# Patient Record
Sex: Male | Born: 1970 | Race: White | Hispanic: No | Marital: Married | Smoking: Former smoker
Health system: Southern US, Community
[De-identification: ages and names within clinical notes are randomized; demographics above are authoritative.]

## PROBLEM LIST (undated history)

## (undated) DIAGNOSIS — K219 Gastro-esophageal reflux disease without esophagitis: Secondary | ICD-10-CM

## (undated) DIAGNOSIS — E7800 Pure hypercholesterolemia, unspecified: Secondary | ICD-10-CM

## (undated) HISTORY — PX: APPENDECTOMY: SHX54

## (undated) HISTORY — PX: OTHER SURGICAL HISTORY: SHX169

---

## 2000-11-21 ENCOUNTER — Encounter: Payer: Self-pay | Admitting: Family Medicine

## 2000-11-21 ENCOUNTER — Ambulatory Visit (HOSPITAL_COMMUNITY): Admission: RE | Admit: 2000-11-21 | Discharge: 2000-11-21 | Payer: Self-pay | Admitting: Family Medicine

## 2002-04-12 ENCOUNTER — Ambulatory Visit (HOSPITAL_COMMUNITY): Admission: RE | Admit: 2002-04-12 | Discharge: 2002-04-12 | Payer: Self-pay | Admitting: Family Medicine

## 2002-04-12 ENCOUNTER — Encounter: Payer: Self-pay | Admitting: Family Medicine

## 2002-07-17 ENCOUNTER — Ambulatory Visit (HOSPITAL_COMMUNITY): Admission: RE | Admit: 2002-07-17 | Discharge: 2002-07-17 | Payer: Self-pay | Admitting: General Surgery

## 2003-01-16 ENCOUNTER — Encounter: Payer: Self-pay | Admitting: Emergency Medicine

## 2003-01-16 ENCOUNTER — Observation Stay (HOSPITAL_COMMUNITY): Admission: EM | Admit: 2003-01-16 | Discharge: 2003-01-17 | Payer: Self-pay | Admitting: Emergency Medicine

## 2004-11-04 ENCOUNTER — Ambulatory Visit (HOSPITAL_COMMUNITY): Admission: RE | Admit: 2004-11-04 | Discharge: 2004-11-04 | Payer: Self-pay | Admitting: Family Medicine

## 2006-02-21 ENCOUNTER — Encounter (INDEPENDENT_AMBULATORY_CARE_PROVIDER_SITE_OTHER): Payer: Self-pay | Admitting: *Deleted

## 2006-02-21 ENCOUNTER — Ambulatory Visit (HOSPITAL_COMMUNITY): Admission: RE | Admit: 2006-02-21 | Discharge: 2006-02-21 | Payer: Self-pay | Admitting: General Surgery

## 2007-10-17 ENCOUNTER — Encounter: Admission: RE | Admit: 2007-10-17 | Discharge: 2007-10-17 | Payer: Self-pay | Admitting: Family Medicine

## 2008-04-16 ENCOUNTER — Encounter: Admission: RE | Admit: 2008-04-16 | Discharge: 2008-04-16 | Payer: Self-pay | Admitting: Gastroenterology

## 2008-04-29 ENCOUNTER — Ambulatory Visit (HOSPITAL_COMMUNITY): Admission: RE | Admit: 2008-04-29 | Discharge: 2008-04-29 | Payer: Self-pay | Admitting: Gastroenterology

## 2008-07-30 ENCOUNTER — Encounter: Admission: RE | Admit: 2008-07-30 | Discharge: 2008-07-30 | Payer: Self-pay | Admitting: Gastroenterology

## 2009-09-14 IMAGING — NM NM HEPATO W/GB/PHARM/[PERSON_NAME]
2 series · 12 of 12 positions shown · non-contrast
Comparison: Ultrasound 04/29/2008

CLINICAL DATA: Abdominal pain, nausea.

NUCLEAR MEDICINE HEPATOBILIARY IMAGING WITH GALLBLADDER EF
TECHNIQUE: Sequential images of the abdomen were obtained [DATE]
minutes following intravenous administration of
radiopharmaceutical.  After slow intravenous infusion of 4.5ucg
Cholecystokinin, gallbladder ejection fraction was determined.
Radiopharmaceutical:  2.QmCi Pc-77m Choletec

[Series 1: he hepato · 4.75mm/px · 6 of 60 frames shown (1 of 2)]
[frame 6/60]
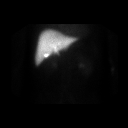
[frame 16/60]
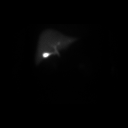
[frame 26/60]
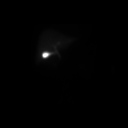
[frame 36/60]
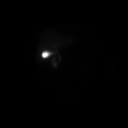
[frame 46/60]
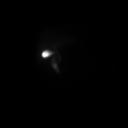
[frame 56/60]
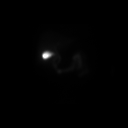

[Series 1: he hepato · 4.75mm/px · 6 of 32 frames shown (2 of 2)]
[frame 3/32]
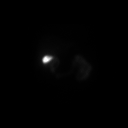
[frame 8/32]
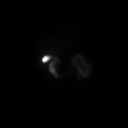
[frame 14/32]
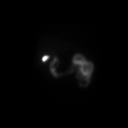
[frame 19/32]
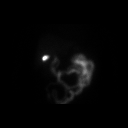
[frame 24/32]
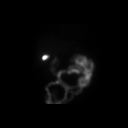
[frame 30/32]
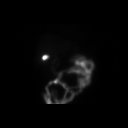

[12 of 12 positions shown; findings below may reference images not displayed]

FINDINGS: There is prompt uptake and excretion of radiotracer by
the liver.  No evidence of cystic duct or common duct obstruction.
Gallbladder ejection fraction is 80%.  Normal is greater than 35%.

The patient did not experience symptoms during CCK infusion.
IMPRESSION: No evidence of obstruction.  Normal gallbladder ejection fraction.

## 2010-10-30 NOTE — H&P (Signed)
NAMEBRYAN, Alexander Garza                ACCOUNT NO.:  [id number]   MEDICAL RECORD NO.:  [id number]          PATIENT TYPE:  AMB   LOCATION:  DAY                           FACILITY:  [facility]   PHYSICIAN:  Dalia Heading, M.D.  DATE OF BIRTH:  12/23/1970   DATE OF ADMISSION:  02/21/2006  DATE OF DISCHARGE:  LH                                HISTORY & PHYSICAL   CHIEF COMPLAINT:  Multiple subcutaneous neoplasms.   HISTORY OF PRESENT ILLNESS:  The patient is a 40 year old white male who  presents with enlargement of multiple masses in the subcutaneous region.  One is inferior to the left breast and another one is along the left hip.  He has multiple other neoplasms which he would like removed.   PAST MEDICAL HISTORY:  History of sinus infections, GERD.   PAST SURGICAL HISTORY:  Laparoscopic appendectomy.   CURRENT MEDICATIONS:  Prevacid.   ALLERGIES:  No known drug allergies.   REVIEW OF SYSTEMS:  The patient smokes a pack of cigarettes a day.  He  denies any significant alcohol use.  He denies any other cardiopulmonary  difficulties or bleeding disorders.   PHYSICAL EXAMINATION:  GENERAL:  The patient is a well-developed, well-  nourished white male in no acute distress.  LUNGS:  Clear to auscultation with equal breath sounds bilaterally.  HEART:  Examination reveals a regular rate and rhythm without S3,  S4, or  murmurs.  SKIN:  Examination reveals a various size subcutaneous masses which are oval  and rubbery in nature.  They are in the left anterior chest wall, left  forearm, sternum, left hip, and right thigh.   IMPRESSION:  Multiple subcutaneous neoplasms.   PLAN:  The patient is scheduled for excision of the multiple neoplasms of  the skin on February 21, 2006.  The risks and benefits of the procedures  including bleeding, infection, and recurrence of the neoplasms were fully  explained to the patient, who gave informed consent.      Dalia Heading, M.D.  Electronically  Signed     MAJ/MEDQ  D:  01/25/2006  T:  01/25/2006  Job:  [id number]   cc:   Patrica Duel, M.D.  Fax: [phone]

## 2010-10-30 NOTE — Op Note (Signed)
NAMETEION, BALLIN                ACCOUNT NO.:  [id number]   MEDICAL RECORD NO.:  [id number]          PATIENT TYPE:  AMB   LOCATION:  DAY                           FACILITY:  [facility]   PHYSICIAN:  Dalia Heading, M.D.  DATE OF BIRTH:  [birth year]   DATE OF PROCEDURE:  02/21/2006  DATE OF DISCHARGE:                                 OPERATIVE REPORT   AGE:  40 years old.   PREOPERATIVE DIAGNOSIS:  Multiple subcutaneous neoplasms, chest wall, left  arm, legs.   POSTOPERATIVE DIAGNOSIS:  Multiple subcutaneous neoplasms, chest wall, left  arm, legs.   PROCEDURE:  Excision of multiple subcutaneous neoplasms, chest wall, left  forearm, bilateral thighs.   SURGEON:  Dr. Franky Macho.   ANESTHESIA:  General endotracheal.   INDICATIONS:  The patient is a 40 year old white male who presents with  multiple enlarging subcutaneous masses on his body.  He has two on the chest  wall, one on the left forearm, one on the left thigh, and one on the right  thigh.  All are various sizes.  The smallest is the mid chest wall which is  1 cm in size and the largest one was on the left thigh and that was 5 cm in  its greatest diameter.  The patient now comes to the operating room for  excision of the neoplasms.  The risks and benefits of the procedure  including recurrence of the masses were fully explained to the patient, who  gave informed consent.   DESCRIPTION OF PROCEDURE:  The patient was placed in supine position.  After  induction of general endotracheal anesthesia, the right thigh, left thigh,  anterior chest wall, and left forearm were all prepped and draped using the  usual sterile technique with Betadine.  Surgical site confirmation was  performed.   The right thigh subcutaneous mass which was approximately 3 cm in its  greatest diameter along the lateral aspect of the thigh was excised without  difficulty.  Any bleeding was controlled using Bovie electrocautery.  0.5 cm  Sensorcaine was  instilled in the surrounding wound.  The skin was closed  using 4-0 Vicryl subcuticular suture.  Dermabond was then applied.   The left lateral thigh mass which was 5 cm in its greatest diameter was  likewise excised.  Any bleeding was controlled using Bovie electrocautery.  The subcutaneous layer was reapproximated using a 4-0 Vicryl interrupted  suture.  The skin was closed using a 4-0 Prolene vertical mattress suture.  0.5 cm Sensorcaine was instilled in the surrounding wound.  Betadine  ointment was then applied.   The left forearm anteriorly had a 3 cm mass present.  This was excised  without difficulty.  The skin was closed using 4-0 Prolene vertical mattress  suture.  0.5 cm Sensorcaine was instilled in the surrounding wound.  Betadine ointment was applied.   Along the left anterior chest wall, inferior to the nipple, a 5 cm lipoma  was found.  This was excised without difficulty.  The subcutaneous layer was  reapproximated using a 3-0 Vicryl interrupted suture.  The  skin was closed  using a 4-0 Vicryl subcuticular suture.  0.5 cm Sensorcaine was instilled in  the surrounding wound.  Dermabond was then applied.   A 1 cm lipoma was found just below the sternal notch anteriorly along the  midline.  This was excised without difficulty.  The skin was reapproximated  using a 4-0 Vicryl interrupted suture.  Dermabond was then applied.  0.5 cm  Sensorcaine was instilled into the surrounding wound.   All of the masses appeared to be lipomas.  All were sent separately to  pathology for further examination and treatment.   The patient tolerated the procedure well.  He was extubated in the operating  room and went back to the recovery room awake in stable condition.  Complications none.   SPECIMEN:  Multiple benign-appearing neoplasms, right thigh, left thigh,  left forearm, 2 on the anterior chest wall.  Blood loss minimal.      Dalia Heading, M.D.  Electronically  Signed     MAJ/MEDQ  D:  02/21/2006  T:  02/21/2006  Job:  [id number]   cc:   Patrica Duel, M.D.  Fax: [phone]

## 2010-10-30 NOTE — H&P (Signed)
   NAMEDAMETRIUS, SANJUAN NO.:  [id number]   MEDICAL RECORD NO.:  [id number]                  PATIENT TYPE:   LOCATION:                                       FACILITY:  [facility]   PHYSICIAN:  Dalia Heading, M.D.               DATE OF BIRTH:  [birth year]   DATE OF ADMISSION:  DATE OF DISCHARGE:                                HISTORY & PHYSICAL   CHIEF COMPLAINT:  Dysphagia, GERD.   HISTORY OF PRESENT ILLNESS:  The patient is a 40 year old white male who is  referred for an EGD.  He has been having epigastric pain, GERD, and sore  throat intermittently for the past three weeks.  Aciphex has been only  mildly helpful.  No nausea or vomiting have been noted.  He also complains  of some dysphagia of solid foods.  He denies any lightheadedness, weight  loss, fever, constipation, diarrhea, hematochezia, or melena.  There is no  history of peptic ulcer disease.   PAST MEDICAL HISTORY:  Sinus infection.   PAST SURGICAL HISTORY:  Unremarkable.   CURRENT MEDICATIONS:  1. Aciphex.  2. Ambien.   ALLERGIES:  No known drug allergies.   REVIEW OF SYSTEMS:  The patient does smoke a pack of cigarettes a day.  Denies any significant alcohol use.  He denies any other cardiopulmonary  difficulties or bleeding disorders.   PHYSICAL EXAMINATION:  GENERAL:  The patient is a well-developed, well-  nourished white male in no acute distress.  VITAL SIGNS:  He is afebrile and vital signs are stable.  NECK:  Supple without lymphadenopathy.  LUNGS:  Clear to auscultation with equal breath sounds bilaterally.  HEART:  Regular rate and rhythm without S3, S4, or murmurs.  ABDOMEN:  Soft, nontender, nondistended.  No hepatosplenomegaly or masses  are noted.   IMPRESSION:  Dysphagia, gastroesophageal reflux disease.   PLAN:  The patient is scheduled for an EGD on July 17, 2002.  The risks  and benefits of the procedure including bleeding and perforation were fully  explained to the patient, gave informed consent.                                              Dalia Heading, M.D.   MAJ/MEDQ  D:  07/12/2002  T:  07/12/2002  Job:  [id number]

## 2010-10-30 NOTE — Op Note (Signed)
NAME:  Alexander Garza, Alexander Garza                          ACCOUNT NO.:  [id number]   MEDICAL RECORD NO.:  [id number]                   PATIENT TYPE:  EMS   LOCATION:  ED                                   FACILITY:  [facility]   PHYSICIAN:  Dalia Heading, M.D.               DATE OF BIRTH:  May 26, 1971   DATE OF PROCEDURE:  01/16/2003  DATE OF DISCHARGE:                                 OPERATIVE REPORT   PREOPERATIVE DIAGNOSIS:  Acute appendicitis.   POSTOPERATIVE DIAGNOSIS:  Acute appendicitis.   PROCEDURE:  Laparoscopic appendectomy.   SURGEON:  Dalia Heading, M.D.   ANESTHESIA:  General endotracheal   INDICATIONS:  The patient is a 40 year old white male who presents with a  mild leukocytosis and right lower quadrant abdominal pain.  CT scan of the  abdomen and pelvis reveals acute appendicitis.  The patient now comes to the  operating room for laparoscopic appendectomy.  The risks and benefits of the  procedure including bleeding, infection, and the possibility of an open  procedure were fully explained to the patient, who gave informed consent.   DESCRIPTION OF PROCEDURE:  The patient was placed in the supine position.  After induction of general endotracheal anesthesia, the abdomen was prepped  and draped using the usual sterile technique with Betadine.  Surgical site  confirmation was performed.   A supraumbilical incision was made down to the fascia.  A Veress needle was  introduced into the abdominal cavity and confirmation of placement was done  using the saline drop test.  The abdomen was then insufflated to 16 mmHg  pressure.  An 11-mm trocar was introduced into the abdominal cavity under  direct visualization without difficulty.  An additional 12-mm trocar was  placed in the suprapubic region and a 5-mm trocar was placed in the left  lower quadrant region.   The appendix was visualized and noted to be inflamed.  The mesoappendix was  divided using the harmonic scalpel.  An  Endo-GIA was placed across the base  of the appendix and fired.  The appendix was removed using an EndoCatch bag.  The staple line was inspected and noted to be within normal limits.  All  fluid and air were then evacuated from the abdominal cavity prior to removal  of the trocars.   All wounds were irrigated with normal saline.  All wounds were injected with  0.5% Sensorcaine.  The supraumbilical fascia as well as suprapubic fascia  were reapproximated using an #0 Vicryl interrupted suture. All skin  incisions were closed using staples.  Betadine ointment and dry sterile  dressings were applied.   All tape and needle counts were correct at the end of the procedure.  The  patient was extubated in the operating room and went back to recovery room  awake and stable condition.   COMPLICATIONS:  None.   SPECIMENS:  Appendix.   BLOOD  LOSS:  Minimal.                                               Dalia Heading, M.D.    MAJ/MEDQ  D:  01/16/2003  T:  01/16/2003  Job:  [id number]   cc:   Patrica Duel, M.D.  [address]  [place]  [place] [address]  Fax: [phone]

## 2013-02-20 ENCOUNTER — Ambulatory Visit (INDEPENDENT_AMBULATORY_CARE_PROVIDER_SITE_OTHER): Payer: BC Managed Care – PPO | Admitting: Urology

## 2013-02-20 DIAGNOSIS — Z3009 Encounter for other general counseling and advice on contraception: Secondary | ICD-10-CM

## 2013-03-06 ENCOUNTER — Encounter (INDEPENDENT_AMBULATORY_CARE_PROVIDER_SITE_OTHER): Payer: BC Managed Care – PPO | Admitting: Urology

## 2013-03-06 DIAGNOSIS — Z302 Encounter for sterilization: Secondary | ICD-10-CM

## 2013-03-13 ENCOUNTER — Ambulatory Visit (INDEPENDENT_AMBULATORY_CARE_PROVIDER_SITE_OTHER): Payer: Self-pay | Admitting: Urology

## 2013-03-13 DIAGNOSIS — Z09 Encounter for follow-up examination after completed treatment for conditions other than malignant neoplasm: Secondary | ICD-10-CM

## 2014-12-23 ENCOUNTER — Encounter (HOSPITAL_COMMUNITY): Payer: Self-pay | Admitting: *Deleted

## 2014-12-23 ENCOUNTER — Emergency Department (HOSPITAL_COMMUNITY)
Admission: EM | Admit: 2014-12-23 | Discharge: 2014-12-24 | Disposition: A | Discharge status: Home / Self Care | Payer: BLUE CROSS/BLUE SHIELD | Attending: Emergency Medicine | Admitting: Emergency Medicine

## 2014-12-23 DIAGNOSIS — R040 Epistaxis: Secondary | ICD-10-CM | POA: Insufficient documentation

## 2014-12-23 DIAGNOSIS — Z8639 Personal history of other endocrine, nutritional and metabolic disease: Secondary | ICD-10-CM | POA: Diagnosis not present

## 2014-12-23 DIAGNOSIS — Z720 Tobacco use: Secondary | ICD-10-CM | POA: Insufficient documentation

## 2014-12-23 DIAGNOSIS — Z8719 Personal history of other diseases of the digestive system: Secondary | ICD-10-CM | POA: Insufficient documentation

## 2014-12-23 HISTORY — DX: Pure hypercholesterolemia, unspecified: E78.00

## 2014-12-23 HISTORY — DX: Gastro-esophageal reflux disease without esophagitis: K21.9

## 2014-12-23 LAB — CBC
HCT: 42.5 % (ref 39.0–52.0)
Hemoglobin: 14.7 g/dL (ref 13.0–17.0)
MCH: 31.9 pg (ref 26.0–34.0)
MCHC: 34.6 g/dL (ref 30.0–36.0)
MCV: 92.2 fL (ref 78.0–100.0)
Platelets: 229 10*3/uL (ref 150–400)
RBC: 4.61 MIL/uL (ref 4.22–5.81)
RDW: 13.5 % (ref 11.5–15.5)
WBC: 8.5 10*3/uL (ref 4.0–10.5)

## 2014-12-23 LAB — BASIC METABOLIC PANEL
Anion gap: 9 (ref 5–15)
BUN: 21 mg/dL — ABNORMAL HIGH (ref 6–20)
CO2: 25 mmol/L (ref 22–32)
Calcium: 8.7 mg/dL — ABNORMAL LOW (ref 8.9–10.3)
Chloride: 107 mmol/L (ref 101–111)
Creatinine, Ser: 1.05 mg/dL (ref 0.61–1.24)
GFR calc Af Amer: >60 mL/min (ref >60)
GFR calc non Af Amer: >60 mL/min (ref >60)
Glucose, Bld: 95 mg/dL (ref 65–99)
Potassium: 3.8 mmol/L (ref 3.5–5.1)
Sodium: 141 mmol/L (ref 135–145)

## 2014-12-23 LAB — PROTIME-INR
INR: 1.02 (ref 0.00–1.49)
Prothrombin Time: 13.6 seconds (ref 11.6–15.2)

## 2014-12-23 MED ORDER — SILVER NITRATE-POT NITRATE 75-25 % EX MISC
1.0000 "application " | Freq: Once | CUTANEOUS | Status: AC
  Administered 2014-12-23: 1 via TOPICAL
  Filled 2014-12-23: qty 1

## 2014-12-23 MED ORDER — OXYMETAZOLINE HCL 0.05 % NA SOLN
2.0000 | Freq: Once | NASAL | Status: AC
  Administered 2014-12-23: 2 via NASAL
  Filled 2014-12-23: qty 15

## 2014-12-23 NOTE — ED Notes (Signed)
Pt c/o nosebleed x 2 days but tonight it has gotten progressively worse

## 2014-12-23 NOTE — ED Notes (Signed)
   12/23/14 2308  HEENT  HEENT (WDL) X  Nose Other (Comment)  Nasal Drainage Color Bloody  pt says nose bleeding on & off for the past 2 days. Says nose starts bleeding when he bends over. Was plugging a tire & felt like pressure in head before bleeding started.

## 2014-12-23 NOTE — ED Provider Notes (Signed)
CSN: [id number]   Arrival date & time 12/23/14 2037  History  This chart was scribed for  No att. providers found by Bethel Born, ED Scribe. This patient was seen in room APA04/APA04 and the patient's care was started at 11:21 PM.  Chief Complaint  Patient presents with  . Epistaxis    HPI HPI Alexander Garza is a 44 y.o. male who presents to the Emergency Department complaining of left-sided epistaxis with sudden onset 2 days ago after bending over. Today he had 3 episodes of bleeding from the same side after bending. The episodes last for 5 minutes but the last episode today lasted 30 minutes. He notes that blood was pouring out of his nose and he was passing clots. 1 day before the onset of symptoms the patient states that he was straining to plug a tire and all the veins in his neck and head were bulging. He cut his right nostril clipping his nasal hairs and was recently treated with clindamycin for MRSA at Urgent care in [facility].  No history of frequent epistaxis. No anticoagulation. Pt denies cold symptoms, facial pain, headache, and bruising easily. [place] 1 [place]2 [place]. Tobacco+. Occasional alcohol use. Remodels homes for work.   PCP Dr Shary Decamp  Past Medical History  Diagnosis Date  . Hypercholesteremia   . Acid reflux disease     Past Surgical History  Procedure Laterality Date  . Appendectomy    . Fatty tumor removal      chest, arm and leg    History reviewed. No pertinent family history.  History  Substance Use Topics  . Smoking status: Current Every Day Smoker -- 0.50 packs/day  . Smokeless tobacco: Not on file  . Alcohol Use: Yes     Comment: occasionally   self-employed  Review of Systems  HENT: Positive for nosebleeds.   Respiratory: Negative for cough and sputum production.   Neurological: Negative for headaches.  Endo/Heme/Allergies: Does not bruise/bleed easily.  All other systems reviewed and  are negative.   Home Medications  none Prior to Admission medications   Not on File    Allergies  Review of patient's allergies indicates no known allergies.  Triage Vitals: BP 106/75 mmHg  Pulse 50  Temp(Src) 98.1 F (36.7 C) (Oral)  Resp 16  Ht  (1.854 m)  Wt 195 lb (88.451 kg)  BMI 25.73 kg/m2  SpO2 98%  Vital signs normal except for bradycardia   Physical Exam  Constitutional: He is oriented to person, place, and time. He appears well-developed and well-nourished.  Non-toxic appearance. He does not appear ill. No distress.  HENT:  Head: Normocephalic and atraumatic.  Right Ear: External ear normal.  Left Ear: External ear normal.  Nose: Nose normal. No mucosal edema or rhinorrhea.  Mouth/Throat: Oropharynx is clear and moist and mucous membranes are normal. No dental abscesses or uvula swelling.  Both nares were inspected. There was 1 area on the left mid septum that appeared to be the source of bleeding. No blood seen in the posterior pharynx. No active bleeding of the nose noted.  Eyes: Conjunctivae and EOM are normal. Pupils are equal, round, and reactive to light.  Neck: Normal range of motion and full passive range of motion without pain. Neck supple.  Pulmonary/Chest: Effort normal. No respiratory distress. He has no rhonchi. He exhibits no crepitus.  Abdominal: Normal appearance.  Musculoskeletal: Normal range of motion.  Moves all  extremities well.   Neurological: He is alert and oriented to person, place, and time. He has normal strength. No cranial nerve deficit.  Skin: Skin is warm, dry and intact. No rash noted. No erythema. No pallor.  Psychiatric: He has a normal mood and affect. His speech is normal and behavior is normal. His mood appears not anxious.  Nursing note and vitals reviewed.   ED Course  Procedures Medications  oxymetazoline (AFRIN) 0.05 % nasal spray 2 spray (2 sprays Left Nare Given 12/23/14 2343)  silver nitrate applicators  applicator 1 application (1 application Topical Given 12/23/14 2343)     DIAGNOSTIC STUDIES: Oxygen Saturation is with 98% on RA, normal by my interpretation.    COORDINATION OF CARE: 11:27 PM Discussed treatment plan which includes lab work and cauterization with pt at bedside and pt agreed to plan.  Afrin was sprayed in the patient's left nostril. I used a silver nitrate stick to cauterize his left nasal septum. Patient had no bleeding while in the ED. His wife states she sees Dr. Suszanne Connerseoh. We discussed what to do this nose rebleeds which includes ice packs to his forehead, sprain his nose with Afrin soaked pledget, and appropriately pinching the nose for 5 minutes.  Labs Review-  Results for orders placed or performed during the hospital encounter of 12/23/14  CBC  Result Value Ref Range   WBC 8.5 4.0 - 10.5 K/uL   RBC 4.61 4.22 - 5.81 MIL/uL   Hemoglobin 14.7 13.0 - 17.0 g/dL   HCT 16.142.5 09.639.0 - 04.552.0 %   MCV 92.2 78.0 - 100.0 fL   MCH 31.9 26.0 - 34.0 pg   MCHC 34.6 30.0 - 36.0 g/dL   RDW 40.913.5 81.111.5 - 91.415.5 %   Platelets 229 150 - 400 K/uL  Protime-INR  Result Value Ref Range   Prothrombin Time 13.6 11.6 - 15.2 seconds   INR 1.02 0.00 - 1.49  Basic metabolic panel  Result Value Ref Range   Sodium 141 135 - 145 mmol/L   Potassium 3.8 3.5 - 5.1 mmol/L   Chloride 107 101 - 111 mmol/L   CO2 25 22 - 32 mmol/L   Glucose, Bld 95 65 - 99 mg/dL   BUN 21 (H) 6 - 20 mg/dL   Creatinine, Ser 7.821.05 0.61 - 1.24 mg/dL   Calcium 8.7 (L) 8.9 - 10.3 mg/dL   GFR calc non Af Amer >60 >60 mL/min   GFR calc Af Amer >60 >60 mL/min   Anion gap 9 5 - 15   Laboratory interpretation all normal     Imaging Review No results found.  EKG Interpretation None      MDM   Final diagnoses:  Left-sided epistaxis   Plan discharge  Devoria AlbeIva Duanna Runk, MD, FACEP   I personally performed the services described in this documentation, which was scribed in my presence. The recorded information has been  reviewed and considered.  Devoria AlbeIva Raeanne Deschler, MD, Concha PyoFACEP     Kateria Cutrona, MD 12/24/14 [phone]

## 2014-12-24 NOTE — ED Notes (Signed)
Pt alert & oriented x4, stable gait. Patient given discharge instructions, paperwork & prescription(s). Patient  instructed to stop at the registration desk to finish any additional paperwork. Patient verbalized understanding. Pt left department w/ no further questions. 

## 2014-12-24 NOTE — Discharge Instructions (Signed)
Use the afrin spray if the bleeding starts again. You can also put an ice pack on your forehead and pinch your nose like an old fashioned clothes pin and hold it for 5 minutes to stop the bleeding. If you continue to have bleeding, call Dr Suszanne Connerseoh, ENT to have him recheck you.   Nosebleed Nosebleeds can be caused by many conditions, including trauma, infections, polyps, foreign bodies, dry mucous membranes or climate, medicines, and air conditioning. Most nosebleeds occur in the front of the nose. Because of this location, most nosebleeds can be controlled by pinching the nostrils gently and continuously for at least 10 to 20 minutes. The long, continuous pressure allows enough time for the blood to clot. If pressure is released during that 10 to 20 minute time period, the process may have to be started again. The nosebleed may stop by itself or quit with pressure, or it may need concentrated heating (cautery) or pressure from packing. HOME CARE INSTRUCTIONS   If your nose was packed, try to maintain the pack inside until your health care provider removes it. If a gauze pack was used and it starts to fall out, gently replace it or cut the end off. Do not cut if a balloon catheter was used to pack the nose. Otherwise, do not remove unless instructed.  Avoid blowing your nose for 12 hours after treatment. This could dislodge the pack or clot and start the bleeding again.  If the bleeding starts again, sit up and bend forward, gently pinching the front half of your nose continuously for 20 minutes.  If bleeding was caused by dry mucous membranes, use over-the-counter saline nasal spray or gel. This will keep the mucous membranes moist and allow them to heal. If you must use a lubricant, choose the water-soluble variety. Use it only sparingly and not within several hours of lying down.  Do not use petroleum jelly or mineral oil, as these may drip into the lungs and cause serious problems.  Maintain  humidity in your home by using less air conditioning or by using a humidifier.  Do not use aspirin or medicines which make bleeding more likely. Your health care provider can give you recommendations on this.  Resume normal activities as you are able, but try to avoid straining, lifting, or bending at the waist for several days.  If the nosebleeds become recurrent and the cause is unknown, your health care provider may suggest laboratory tests. SEEK MEDICAL CARE IF: You have a fever. SEEK IMMEDIATE MEDICAL CARE IF:   Bleeding recurs and cannot be controlled.  There is unusual bleeding from or bruising on other parts of the body.  Nosebleeds continue.  There is any worsening of the condition which originally brought you in.  You become light-headed, feel faint, become sweaty, or vomit blood. MAKE SURE YOU:   Understand these instructions.  Will watch your condition.  Will get help right away if you are not doing well or get worse. Document Released: 03/10/2005 Document Revised: 10/15/2013 Document Reviewed: 05/01/2009 [facility] Patient Information 2015 [place], [place]. This information is not intended to replace advice given to you by your health care provider. Make sure you discuss any questions you have with your health care provider.

## 2016-08-05 ENCOUNTER — Encounter (INDEPENDENT_AMBULATORY_CARE_PROVIDER_SITE_OTHER): Payer: Self-pay | Admitting: Orthopedic Surgery

## 2016-08-05 ENCOUNTER — Ambulatory Visit (INDEPENDENT_AMBULATORY_CARE_PROVIDER_SITE_OTHER): Payer: Managed Care, Other (non HMO) | Admitting: Orthopedic Surgery

## 2016-08-05 ENCOUNTER — Ambulatory Visit (INDEPENDENT_AMBULATORY_CARE_PROVIDER_SITE_OTHER): Payer: Self-pay

## 2016-08-05 VITALS — Ht 73.0 in | Wt 195.0 lb

## 2016-08-05 DIAGNOSIS — M25562 Pain in left knee: Secondary | ICD-10-CM | POA: Diagnosis not present

## 2016-08-05 NOTE — Progress Notes (Signed)
   Office Visit Note   Patient: Alexander Garza           Date of Birth: 05/02/1971           MRN: [id number] Visit Date: 08/05/2016              Requested by: No referring provider defined for this encounter. PCP: No primary care provider on file.  Chief Complaint  Patient presents with  . Left Knee - Pain    HPI: Patient is here for acute onset of left knee pain. Anterior and down the shin and posterior pain. He states that he is " always on his knees" and was laying concrete floor for two days and that the pain started right away. He has had some swelling. Autumn L Forrest, RMA   Patient states that recently he was not using his kneepads while kneeling and that his knee pain started after this episode. Assessment & Plan: Visit Diagnoses:  1. Acute pain of left knee     Plan: Knee was aspirated and injected. Plan to use his knee pads for his work activities. 30 mL of clear synovial fluid was aspirated no signs of trauma. No evidence of any acute injury to the accessory patella fragment.  Follow-Up Instructions: Return if symptoms worsen or fail to improve.   Ortho Exam Patient is alert oriented no adenopathy well-dressed normal affect normal respiratory effort he has a normal gait. Patient has a moderate effusion of the left knee there is no redness no cellulitis no tenderness to palpation of the medial lateral joint line. The patellofemoral joint is nontender to palpation. Classic resector stable.  Imaging: Xr Knee 1-2 Views Left  Result Date: 08/05/2016 2 view radiographs left knee shows congruent joint space no subchondral sclerosis or cyst. 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   Orders:  Orders Placed This Encounter  Procedures  . XR Knee 1-2 Views Left   No orders of the defined types were placed in this encounter.    Procedures: No procedures performed  Clinical Data: No additional  findings.  Subjective: Review of Systems  Objective: Vital Signs: Ht 6\' 1"  (1.854 m)   Wt 195 lb (88.5 kg)   BMI 25.73 kg/m   Specialty Comments:  No specialty comments available.  PMFS History: There are no active problems to display for this patient.  Past Medical History:  Diagnosis Date  . Acid reflux disease   . Hypercholesteremia     No family history on file.  Past Surgical History:  Procedure Laterality Date  . APPENDECTOMY    . fatty tumor removal     chest, arm and leg   Social History   Occupational History  . Not on file.   Social History Main Topics  . Smoking status: Current Every Day Smoker    Packs/day: 0.50    Years: 25.00  . Smokeless tobacco: Never Used  . Alcohol use Yes     Comment: occasionally  . Drug use: No  . Sexual activity: Not on file

## 2016-11-01 ENCOUNTER — Encounter (INDEPENDENT_AMBULATORY_CARE_PROVIDER_SITE_OTHER): Payer: Self-pay | Admitting: Orthopedic Surgery

## 2016-11-01 ENCOUNTER — Ambulatory Visit (INDEPENDENT_AMBULATORY_CARE_PROVIDER_SITE_OTHER): Payer: Managed Care, Other (non HMO) | Admitting: Orthopedic Surgery

## 2016-11-01 DIAGNOSIS — M25562 Pain in left knee: Secondary | ICD-10-CM | POA: Diagnosis not present

## 2016-11-03 NOTE — Progress Notes (Signed)
   Office Visit Note   Patient: Alexander Garza           Date of Birth: August 07, 1970           MRN: [id number] Visit Date: 11/01/2016 Requested by: No referring provider defined for this encounter. PCP: System, Pcp Not In  Subjective: Chief Complaint  Patient presents with  . Left Knee - Pain    HPI: Alexander Garza is a 46 year old patient with left knee pain.  He is seen 6 weeks ago and radiographs were made.  He was noted to have a bipartite patella at that time.  Fluid was drained and cortisone shot performed.  He feels like there is swelling and clicking on the medial aspect of the knee.  He feels like he has to "clicking" back into place when he has fluid in it.  He has not had MRI scan.  He works with his knee flexed working on the floors.  He's had symptoms for 3 months.  He takes nonsteroidals and glucosamine.              ROS: All systems reviewed are negative as they relate to the chief complaint within the history of present illness.  Patient denies  fevers or chills.   Assessment & Plan: Visit Diagnoses:  1. Left knee pain, unspecified chronicity     Plan: Impression is left knee pain with likely medial meniscal tear.  He has recurrent effusion and medial joint line tenderness along with mechanical symptoms.  High likelihood based on his location that he does have degenerative meniscal tearing on that medial side.  Plan is MRI scan to evaluate medial meniscal tear.  I'll see him back after that study  Follow-Up Instructions: Return for after MRI.   Orders:  Orders Placed This Encounter  Procedures  . MR Knee Left w/o contrast   No orders of the defined types were placed in this encounter.     Procedures: No procedures performed   Clinical Data: No additional findings.  Objective: Vital Signs: There were no vitals taken for this visit.  Physical Exam:   Constitutional: Patient appears well-developed HEENT:  Head: Normocephalic Eyes:EOM are normal Neck: Normal range  of motion Cardiovascular: Normal rate Pulmonary/chest: Effort normal Neurologic: Patient is alert Skin: Skin is warm Psychiatric: Patient has normal mood and affect    Ortho Exam: Orthopedic exam demonstrates left knee effusion medial joint line tenderness positive Murray compression testing stable collateral crucial ligaments intact since mechanism no other masses lymph and after skin changes in the left knee region no groin pain interlocks rotation leg.  Pedal pulses palpable.  Specialty Comments:  No specialty comments available.  Imaging: No results found.   PMFS History: There are no active problems to display for this patient.  Past Medical History:  Diagnosis Date  . Acid reflux disease   . Hypercholesteremia     No family history on file.  Past Surgical History:  Procedure Laterality Date  . APPENDECTOMY    . fatty tumor removal     chest, arm and leg   Social History   Occupational History  . Not on file.   Social History Main Topics  . Smoking status: Current Every Day Smoker    Packs/day: 0.50    Years: 25.00  . Smokeless tobacco: Never Used  . Alcohol use Yes     Comment: occasionally  . Drug use: No  . Sexual activity: Not on file

## 2016-11-17 ENCOUNTER — Ambulatory Visit (INDEPENDENT_AMBULATORY_CARE_PROVIDER_SITE_OTHER): Payer: Managed Care, Other (non HMO) | Admitting: Orthopedic Surgery

## 2016-12-14 ENCOUNTER — Other Ambulatory Visit: Payer: BLUE CROSS/BLUE SHIELD

## 2016-12-17 ENCOUNTER — Ambulatory Visit (INDEPENDENT_AMBULATORY_CARE_PROVIDER_SITE_OTHER): Payer: Managed Care, Other (non HMO) | Admitting: Orthopedic Surgery

## 2018-04-11 DIAGNOSIS — Z125 Encounter for screening for malignant neoplasm of prostate: Secondary | ICD-10-CM | POA: Diagnosis not present

## 2018-04-11 DIAGNOSIS — E785 Hyperlipidemia, unspecified: Secondary | ICD-10-CM | POA: Diagnosis not present

## 2018-04-11 DIAGNOSIS — Z0389 Encounter for observation for other suspected diseases and conditions ruled out: Secondary | ICD-10-CM | POA: Diagnosis not present

## 2018-04-11 DIAGNOSIS — R5383 Other fatigue: Secondary | ICD-10-CM | POA: Diagnosis not present

## 2018-04-11 DIAGNOSIS — R6889 Other general symptoms and signs: Secondary | ICD-10-CM | POA: Diagnosis not present

## 2018-04-11 DIAGNOSIS — E559 Vitamin D deficiency, unspecified: Secondary | ICD-10-CM | POA: Diagnosis not present

## 2018-04-11 DIAGNOSIS — Z0000 Encounter for general adult medical examination without abnormal findings: Secondary | ICD-10-CM | POA: Diagnosis not present

## 2018-04-11 DIAGNOSIS — R69 Illness, unspecified: Secondary | ICD-10-CM | POA: Diagnosis not present

## 2018-04-11 DIAGNOSIS — Z131 Encounter for screening for diabetes mellitus: Secondary | ICD-10-CM | POA: Diagnosis not present

## 2018-05-24 DIAGNOSIS — R5383 Other fatigue: Secondary | ICD-10-CM | POA: Diagnosis not present

## 2018-05-24 DIAGNOSIS — R6889 Other general symptoms and signs: Secondary | ICD-10-CM | POA: Diagnosis not present

## 2018-05-24 DIAGNOSIS — E785 Hyperlipidemia, unspecified: Secondary | ICD-10-CM | POA: Diagnosis not present

## 2018-05-24 DIAGNOSIS — E559 Vitamin D deficiency, unspecified: Secondary | ICD-10-CM | POA: Diagnosis not present

## 2018-07-04 DIAGNOSIS — J329 Chronic sinusitis, unspecified: Secondary | ICD-10-CM | POA: Diagnosis not present

## 2018-07-04 DIAGNOSIS — J209 Acute bronchitis, unspecified: Secondary | ICD-10-CM | POA: Diagnosis not present

## 2018-07-04 DIAGNOSIS — J069 Acute upper respiratory infection, unspecified: Secondary | ICD-10-CM | POA: Diagnosis not present

## 2018-07-10 DIAGNOSIS — E559 Vitamin D deficiency, unspecified: Secondary | ICD-10-CM | POA: Diagnosis not present

## 2018-07-10 DIAGNOSIS — E785 Hyperlipidemia, unspecified: Secondary | ICD-10-CM | POA: Diagnosis not present

## 2018-07-10 DIAGNOSIS — J209 Acute bronchitis, unspecified: Secondary | ICD-10-CM | POA: Diagnosis not present

## 2018-10-09 ENCOUNTER — Ambulatory Visit (INDEPENDENT_AMBULATORY_CARE_PROVIDER_SITE_OTHER): Payer: BLUE CROSS/BLUE SHIELD | Admitting: Internal Medicine

## 2018-10-12 DIAGNOSIS — R5383 Other fatigue: Secondary | ICD-10-CM | POA: Diagnosis not present

## 2018-10-12 DIAGNOSIS — E785 Hyperlipidemia, unspecified: Secondary | ICD-10-CM | POA: Diagnosis not present

## 2018-10-12 DIAGNOSIS — R69 Illness, unspecified: Secondary | ICD-10-CM | POA: Diagnosis not present

## 2018-10-12 DIAGNOSIS — E559 Vitamin D deficiency, unspecified: Secondary | ICD-10-CM | POA: Diagnosis not present

## 2018-10-16 DIAGNOSIS — R69 Illness, unspecified: Secondary | ICD-10-CM | POA: Diagnosis not present

## 2018-10-16 DIAGNOSIS — M25869 Other specified joint disorders, unspecified knee: Secondary | ICD-10-CM | POA: Diagnosis not present

## 2018-10-23 ENCOUNTER — Encounter: Payer: Self-pay | Admitting: Orthopedic Surgery

## 2018-10-23 ENCOUNTER — Ambulatory Visit (INDEPENDENT_AMBULATORY_CARE_PROVIDER_SITE_OTHER): Payer: 59

## 2018-10-23 ENCOUNTER — Ambulatory Visit: Payer: 59 | Admitting: Orthopedic Surgery

## 2018-10-23 ENCOUNTER — Other Ambulatory Visit: Payer: Self-pay

## 2018-10-23 VITALS — Ht 73.0 in | Wt 195.0 lb

## 2018-10-23 DIAGNOSIS — M25561 Pain in right knee: Secondary | ICD-10-CM

## 2018-10-23 DIAGNOSIS — M7121 Synovial cyst of popliteal space [Baker], right knee: Secondary | ICD-10-CM | POA: Diagnosis not present

## 2018-10-23 NOTE — Progress Notes (Signed)
Office Visit Note   Patient: Alexander Garza           Date of Birth: [birth year]           MRN: [id number] Visit Date: 10/23/2018              Requested by: No referring provider defined for this encounter. PCP: System, Pcp Not In  Chief Complaint  Patient presents with  . Left Knee - Follow-up  . Right Knee - Follow-up      HPI: Patient is a 48 year old gentleman who presents complaining of a Baker's cyst on the right knee.  He states it is not painful he states he had one on the left knee which felt about the same in the left knee resolved after aspiration of his left knee.  He states his left knee is about 95% better.  He denies any pain in the right knee denies any night pain.  Patient states that he works on his knee in [employer].  Assessment & Plan: Visit Diagnoses:  1. Right knee pain, unspecified chronicity   2. Baker's cyst of knee, right     Plan: Recommended conservative therapy do not feel surgical intervention is necessary.  With patient's persistent working on his knee this may take a long time to resolve.  Follow-Up Instructions: Return if symptoms worsen or fail to improve.   Ortho Exam  Patient is alert, oriented, no adenopathy, well-dressed, normal affect, normal respiratory effort. Examination patient has a normal gait.  Left knee there is no effusion.  Right knee there is no tenderness to palpation the patellofemoral joint or medial lateral joint lines there is no effusion in the right knee.  Collaterals and cruciates are stable.  Patient does have a firm palpable mass in the popliteal fossa consistent with a Baker's cyst.  Imaging: Xr Knee 1-2 Views Right  Result Date: 10/23/2018 2 view radiographs of the right knee shows no bony abnormalities the joint space is congruent there is no effusion.  No images are attached to the encounter.  Labs: No results found for: HGBA1C, ESRSEDRATE, CRP, LABURIC, REPTSTATUS, GRAMSTAIN, CULT, LABORGA   No results  found for: ALBUMIN, PREALBUMIN, LABURIC  Body mass index is 25.73 kg/m.  Orders:  Orders Placed This Encounter  Procedures  . XR Knee 1-2 Views Right   No orders of the defined types were placed in this encounter.    Procedures: No procedures performed  Clinical Data: No additional findings.  ROS:  All other systems negative, except as noted in the HPI. Review of Systems  Objective: Vital Signs: Ht 6\' 1"  (1.854 m)   Wt 195 lb (88.5 kg)   BMI 25.73 kg/m   Specialty Comments:  No specialty comments available.  PMFS History: There are no active problems to display for this patient.  Past Medical History:  Diagnosis Date  . Acid reflux disease   . Hypercholesteremia     History reviewed. No pertinent family history.  Past Surgical History:  Procedure Laterality Date  . APPENDECTOMY    . fatty tumor removal     chest, arm and leg   Social History   Occupational History  . Not on file  Tobacco Use  . Smoking status: Current Every Day Smoker    Packs/day: 0.50    Years: 25.00    Pack years: 12.50  . Smokeless tobacco: Never Used  Substance and Sexual Activity  . Alcohol use: Yes    Comment: occasionally  . Drug  use: No  . Sexual activity: Not on file

## 2019-03-05 ENCOUNTER — Other Ambulatory Visit (INDEPENDENT_AMBULATORY_CARE_PROVIDER_SITE_OTHER): Payer: Self-pay | Admitting: Internal Medicine

## 2019-04-23 ENCOUNTER — Ambulatory Visit (INDEPENDENT_AMBULATORY_CARE_PROVIDER_SITE_OTHER): Payer: 59 | Admitting: Internal Medicine

## 2019-04-23 ENCOUNTER — Other Ambulatory Visit: Payer: Self-pay

## 2019-04-23 ENCOUNTER — Encounter (INDEPENDENT_AMBULATORY_CARE_PROVIDER_SITE_OTHER): Payer: Self-pay | Admitting: Internal Medicine

## 2019-04-23 VITALS — BP 124/80 | HR 64 | Ht 73.0 in | Wt 209.4 lb

## 2019-04-23 DIAGNOSIS — E782 Mixed hyperlipidemia: Secondary | ICD-10-CM | POA: Insufficient documentation

## 2019-04-23 DIAGNOSIS — R6882 Decreased libido: Secondary | ICD-10-CM | POA: Diagnosis not present

## 2019-04-23 DIAGNOSIS — Z0001 Encounter for general adult medical examination with abnormal findings: Secondary | ICD-10-CM | POA: Diagnosis not present

## 2019-04-23 DIAGNOSIS — K219 Gastro-esophageal reflux disease without esophagitis: Secondary | ICD-10-CM

## 2019-04-23 DIAGNOSIS — E559 Vitamin D deficiency, unspecified: Secondary | ICD-10-CM | POA: Diagnosis not present

## 2019-04-23 DIAGNOSIS — E785 Hyperlipidemia, unspecified: Secondary | ICD-10-CM

## 2019-04-23 DIAGNOSIS — Z125 Encounter for screening for malignant neoplasm of prostate: Secondary | ICD-10-CM

## 2019-04-23 DIAGNOSIS — R21 Rash and other nonspecific skin eruption: Secondary | ICD-10-CM

## 2019-04-23 HISTORY — DX: Hyperlipidemia, unspecified: E78.5

## 2019-04-23 HISTORY — DX: Decreased libido: R68.82

## 2019-04-23 HISTORY — DX: Vitamin D deficiency, unspecified: E55.9

## 2019-04-23 HISTORY — DX: Gastro-esophageal reflux disease without esophagitis: K21.9

## 2019-04-23 MED ORDER — DEXLANSOPRAZOLE 60 MG PO CPDR: 60.0000 mg | DELAYED_RELEASE_CAPSULE | Freq: Every day | ORAL | 1 refills | Status: DC

## 2019-04-23 MED ORDER — CLOTRIMAZOLE-BETAMETHASONE 1-0.05 % EX CREA: 1.0000 "application " | TOPICAL_CREAM | Freq: Two times a day (BID) | CUTANEOUS | 2 refills | Status: DC

## 2019-04-23 NOTE — Progress Notes (Signed)
 Chief Complaint: This very pleasant 48-year-old man comes in for an annual physical exam and to address his chronic conditions which are described below. HPI: He does have hyperlipidemia which is quite significant but he does not want to go on statin medications.  In the past, statin medications have given him severe myalgia.  He does not have a history of coronary artery disease or cerebrovascular disease.  He denies any chest pain, dyspnea, palpitations or limb weakness. He also has been taking testosterone therapy for some time now and is tolerating this well.  He feels that it is changed his life with more energy, focus and concentration and a sense of wellbeing.  His testosterone levels were in the very low range about 1 year ago. He also has gastroesophageal reflux disease and PPI Dexilant seems to keep him stable when he needs to take it and he does not take it every day. He also has vitamin D deficiency and he continues to take vitamin D3 supplementation. He is also complaining of a rash on his buttocks which she has had before and has been treated with some sort of cream. Past Medical History:  Diagnosis Date  . Acid reflux disease   . Decreased libido 04/23/2019  . GERD (gastroesophageal reflux disease) 04/23/2019  . HLD (hyperlipidemia) 04/23/2019  . Hypercholesteremia   . Vitamin D deficiency disease 04/23/2019   Past Surgical History:  Procedure Laterality Date  . APPENDECTOMY    . 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    Social History   Tobacco Use  . Smoking status: Former Smoker    Quit date: 02/21/2019    Years since quitting: 0.1  . Smokeless tobacco: Never Used  Substance Use Topics  . Alcohol use: Yes    Alcohol/week: 1.0 standard drinks    Types: 1 Cans of beer per week      Allergies: No Known Allergies   Current Meds   Medication Sig  . Cholecalciferol (VITAMIN D-3) 125 MCG (5000 UT) TABS Take 2 tablets by mouth daily.  . dexlansoprazole (DEXILANT) 60 MG capsule Take 1 capsule (60 mg total) by mouth daily.  . testosterone cypionate (DEPOTESTOSTERONE CYPIONATE) 200 MG/ML injection INJECT 0.5ML INTRAMUSCULARLY TWICE WEEKLY.  . [DISCONTINUED] dexlansoprazole (DEXILANT) 60 MG capsule Take 60 mg by mouth daily.     Nutrition On a daily basis, he does practice intermittent fasting.  Sleep Adequate sleep, uninterrupted.  Exercise Not consistent exercise at the moment but his work involves a lot of manual work.  Bio-identical hormones Testosterone therapy is being used off label for symptoms of testosterone deficiency and benefits that it produces based on several studies.  These benefits include decreasing body fat, increasing in lean muscle mass and increasing in bone density.  There is improvement of memory, cognition.  There is improvement in exercise tolerance and endurance.  Testosterone therapy has also been shown to be protective against coronary artery disease, cerebrovascular disease, diabetes, hypertension and degenerative joint disease. I have discussed with the patient the FDA warnings regarding testosterone therapy, benefits and side effects and modes of administration as well as monitoring blood levels and side effects  on a regular basis The patient is agreeable that testosterone therapy should be an integral part of his/her wellness,quality of life and prevention of chronic disease.  ROS:apart from the symptoms mentioned above,there   are no other symptoms referable to all systems reviewed.  Physical Exam: Blood pressure 124/80, pulse 64, height 6' 1" (1.854 m), weight 209 lb 6.4 oz (95 kg). Vitals with BMI 04/23/2019 10/23/2018 08/05/2016  Height 6' 1" 6' 1" 6' 1"  Weight 209 lbs 6 oz 195 lbs 195 lbs  BMI 27.63 93.81 01.7  Systolic 510 - -  Diastolic 80 - -  Pulse 64 - -      He looks  systemically well. General: Alert, cooperative, and appears to be the stated age.No pallor.  No jaundice.  No clubbing. Head: Normocephalic Eyes: Sclera white, pupils equal and reactive to light, red reflex x 2,  Ears: Normal bilaterally Oral cavity: Lips, mucosa, and tongue normal: Teeth and gums normal Neck: No adenopathy, supple, symmetrical, trachea midline, and thyroid does not appear enlarged Respiratory: Clear to auscultation bilaterally.No wheezing, crackles or bronchial breathing. Cardiovascular: Heart sounds are present and appear to be normal without murmurs or added sounds.  No carotid bruits.  Peripheral pulses are present and equal bilaterally.: Gastrointestinal:positive bowel sounds, no hepatosplenomegaly.  No masses felt.No tenderness. Skin: Clear, No rashes noted.No worrisome skin lesions seen.  He does seem to have several skin lesions on his buttocks, possibly fungal in origin.  They are 3 to 5 mm in diameter and raised, red. Neurological: Grossly intact without focal findings, cranial nerves II through XII intact, muscle strength equal bilaterally Musculoskeletal: No acute joint abnormalities noted.Full range of movement noted with joints. Psychiatric: Affect appropriate, non-anxious.    Assessment  1. Encounter for general adult medical examination with abnormal findings   2. Vitamin D deficiency disease   3. Mixed hyperlipidemia   4. Gastroesophageal reflux disease without esophagitis   5. Decreased libido   6. Special screening for malignant neoplasm of prostate   7. Rash and nonspecific skin eruption     Tests Ordered:   Orders Placed This Encounter  Procedures  . CBC  . Lipid Panel  . PSA  . CMP with eGFR([facility])     Plan  1.  He will continue with testosterone therapy as this seems to have helped him significantly. 2.  I am going to prescribe him Lotrisone cream to see if this will help the rash which I suspect is fungal in origin.  He is very good  with hygiene and he tends to have a shower after work every day. 3.  He will continue with PPI Dexilant and I have sent a new prescription. 4.  Blood work is ordered as above. 5.  He has received Tdap vaccination today. 6.  Further recommendations will depend on blood results and I will see him in 6 months time for follow-up. 7.  Today, in addition to a preventative visit, I performed an office visit to address his chronic conditions above.        Meds ordered this encounter  Medications  . clotrimazole-betamethasone (LOTRISONE) cream    Sig: Apply 1 application topically 2 (two) times daily.    Dispense:  30 g    Refill:  2  . dexlansoprazole (DEXILANT) 60 MG capsule    Sig: Take 1 capsule (60 mg total) by mouth daily.    Dispense:  90 capsule    Refill:  1     Nimish C Gosrani   04/23/2019, 9:27 AM

## 2019-04-24 LAB — LIPID PANEL
Cholesterol: 237 mg/dL — ABNORMAL HIGH (ref <200)
HDL: 43 mg/dL (ref >=40)
LDL Cholesterol (Calc): 164 mg/dL (calc) — ABNORMAL HIGH
Non-HDL Cholesterol (Calc): 194 mg/dL (calc) — ABNORMAL HIGH (ref <130)
Total CHOL/HDL Ratio: 5.5 (calc) — ABNORMAL HIGH (ref <5.0)
Triglycerides: 157 mg/dL — ABNORMAL HIGH (ref <150)

## 2019-04-24 LAB — CBC
HCT: 53.5 % — ABNORMAL HIGH (ref 38.5–50.0)
Hemoglobin: 18.5 g/dL — ABNORMAL HIGH (ref 13.2–17.1)
MCH: 31.9 pg (ref 27.0–33.0)
MCHC: 34.6 g/dL (ref 32.0–36.0)
MCV: 92.2 fL (ref 80.0–100.0)
MPV: 10.7 fL (ref 7.5–12.5)
Platelets: 236 10*3/uL (ref 140–400)
RBC: 5.8 10*6/uL (ref 4.20–5.80)
RDW: 13.6 % (ref 11.0–15.0)
WBC: 9.9 10*3/uL (ref 3.8–10.8)

## 2019-04-24 LAB — COMPLETE METABOLIC PANEL WITH GFR
AG Ratio: 1.6 (calc) (ref 1.0–2.5)
ALT: 26 U/L (ref 9–46)
AST: 23 U/L (ref 10–40)
Albumin: 4.1 g/dL (ref 3.6–5.1)
Alkaline phosphatase (APISO): 60 U/L (ref 36–130)
BUN: 14 mg/dL (ref 7–25)
CO2: 25 mmol/L (ref 20–32)
Calcium: 9.3 mg/dL (ref 8.6–10.3)
Chloride: 102 mmol/L (ref 98–110)
Creat: 0.98 mg/dL (ref 0.60–1.35)
GFR, Est African American: 105 mL/min/{1.73_m2} (ref >=60)
GFR, Est Non African American: 91 mL/min/{1.73_m2} (ref >=60)
Globulin: 2.5 g/dL (calc) (ref 1.9–3.7)
Glucose, Bld: 88 mg/dL (ref 65–99)
Potassium: 4.5 mmol/L (ref 3.5–5.3)
Sodium: 137 mmol/L (ref 135–146)
Total Bilirubin: 0.6 mg/dL (ref 0.2–1.2)
Total Protein: 6.6 g/dL (ref 6.1–8.1)

## 2019-04-24 LAB — PSA: PSA: 0.4 ng/mL (ref <=4.0)

## 2019-05-13 ENCOUNTER — Other Ambulatory Visit (INDEPENDENT_AMBULATORY_CARE_PROVIDER_SITE_OTHER): Payer: Self-pay | Admitting: Internal Medicine

## 2019-05-15 ENCOUNTER — Encounter (INDEPENDENT_AMBULATORY_CARE_PROVIDER_SITE_OTHER): Payer: Self-pay | Admitting: Nurse Practitioner

## 2019-05-15 ENCOUNTER — Telehealth (INDEPENDENT_AMBULATORY_CARE_PROVIDER_SITE_OTHER): Payer: Self-pay | Admitting: Internal Medicine

## 2019-05-15 ENCOUNTER — Other Ambulatory Visit: Payer: Self-pay

## 2019-05-15 ENCOUNTER — Ambulatory Visit (INDEPENDENT_AMBULATORY_CARE_PROVIDER_SITE_OTHER): Payer: 59 | Admitting: Nurse Practitioner

## 2019-05-15 VITALS — BP 128/92 | HR 72 | Temp 98.3°F | Resp 14 | Ht 73.0 in | Wt 205.6 lb

## 2019-05-15 DIAGNOSIS — M79601 Pain in right arm: Secondary | ICD-10-CM

## 2019-05-15 DIAGNOSIS — M79603 Pain in arm, unspecified: Secondary | ICD-10-CM | POA: Insufficient documentation

## 2019-05-15 MED ORDER — CYCLOBENZAPRINE HCL 5 MG PO TABS: 5.0000 mg | ORAL_TABLET | Freq: Every day | ORAL | 1 refills | Status: DC

## 2019-05-15 NOTE — Assessment & Plan Note (Signed)
No obvious infection, rash, necrosis, nodules noted on exam today.  We discussed pain management and he was encouraged to take ibuprofen 3 times a day with meals for 3 days.  I also have prescribed Flexeril that he can take as needed at night for further pain management if needed.  He will also continue Tylenol as needed.  Dr. Anastasio Champion also examined the patient and agrees with this plan.  I did discuss the patient that there is a possibility that he had a type III hypersensitivity reaction to tetanus and that he may want to undergo allergy testing with a specialist for confirmation.  He tells me he would like to hold off on this for now.  He will follow-up as scheduled, but was encouraged to call the office if his symptoms worsen or do not improve over the next week.  He tells me he understands.

## 2019-05-15 NOTE — Patient Instructions (Signed)
Thank you for choosing [facility] as your medical provider! If you have any questions or concerns regarding your health care, please do not hesitate to call our office.  Pain control: 1.  Take 600 mg of ibuprofen every 8 hours with food for 3 days.  If you require additional pain relief at night you can also take Tylenol as well as a Flexeril.  Call this office if the pain worsens or does not improve within the next week or so.  Please follow-up as scheduled in May. We look forward to seeing you again soon! Have a great holiday season!!  At [facility] we value your feedback. You may receive a survey about your visit today. Please share your experience as we strive to create trusting relationships with our patients to provide genuine, compassionate, quality care.  We appreciate your understanding and patience as we review any laboratory studies, imaging, and other diagnostic tests that are ordered as we care for you. We do our best to address any and all results in a timely manner. If you do not hear about test results within 1 week, please do not hesitate to contact us. If we referred you to a specialist during your visit or ordered imaging testing, contact the office if you have not been contacted to be scheduled within 1 weeks.  We also encourage the use of MyChart, which contains your medical information for your review as well. If you are not enrolled in this feature, an access code is on this after visit summary for your convenience. Thank you for allowing [place] to be involved in your care.

## 2019-05-15 NOTE — Progress Notes (Addendum)
Subjective:  Patient ID: Alexander Garza, male    DOB: 09-15-1970  Age: 48 y.o. MRN: [id number]  CC:  Chief Complaint  Patient presents with  . Arm Pain      HPI  This patient comes in today for an acute visit regarding right arm pain.  Approximately 3 weeks ago he did receive a tetanus booster in his right arm.  He tells me approximately 2 days after this booster was administered he started to experience constant throbbing pain.  The pain appears to be worse at night when he is trying to sleep.  He rates the pain as a 4 out of 10 during the day but 8 out of 10 at night.  She has tried heat and ice which have helped his pain mildly, as well as Tylenol at night which seems to control his pain while trying to sleep.  He has not noted any acute skin changes or discolorations.  His range of motion to his right arm and shoulder is slightly reduced due to pain.  Past Medical History:  Diagnosis Date  . Acid reflux disease   . Decreased libido 04/23/2019  . GERD (gastroesophageal reflux disease) 04/23/2019  . HLD (hyperlipidemia) 04/23/2019  . Hypercholesteremia   . Vitamin D deficiency disease 04/23/2019      History reviewed. No pertinent family history.  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   Social History   Tobacco Use  . Smoking status: Former Smoker    Quit date: 02/21/2019    Years since quitting: 0.2  . Smokeless tobacco: Never Used  Substance Use Topics  . Alcohol use: Yes    Alcohol/week: 1.0 standard drinks    Types: 1 Cans of beer per week     Current Meds  Medication Sig  . Cholecalciferol (VITAMIN D-3) 125 MCG (5000 UT) TABS Take 2 tablets by mouth daily.  . clotrimazole-betamethasone (LOTRISONE) cream Apply 1 application topically 2 (two) times daily.  Marland Kitchen dexlansoprazole (DEXILANT) 60 MG capsule Take 1 capsule (60 mg total) by mouth daily.  Marland Kitchen testosterone cypionate  (DEPOTESTOSTERONE CYPIONATE) 200 MG/ML injection INJECT 0.5ML INTRAMUSCULARLY TWICE WEEKLY.    ROS:  Review of Systems  Constitutional: Negative for chills and fever.  Respiratory: Negative for cough and wheezing.   Cardiovascular: Negative for chest pain.  Genitourinary: Negative for dysuria and hematuria.  Musculoskeletal:       ROM affected due to pain  Skin: Negative for rash.       Swelling to right arm  Neurological: Negative for sensory change and weakness.     Objective:   Today's Vitals: BP (!) 128/92   Pulse 72   Temp 98.3 F (36.8 C)   Resp 14   Ht 6\' 1"  (1.854 m)   Wt 205 lb 9.6 oz (93.3 kg)   SpO2 98%   BMI 27.13 kg/m  Vitals with BMI 05/15/2019 04/23/2019 10/23/2018  Height 6\' 1"  6\' 1"  6\' 1"   Weight 205 lbs 10 oz 209 lbs 6 oz 195 lbs  BMI 27.13 27.63 25.73  Systolic 128 124 -  Diastolic 92 80 -  Pulse 72 64 -     Physical Exam Vitals signs reviewed.  Constitutional:      General: He is not in acute distress.    Appearance: Normal appearance.  HENT:     Head: Normocephalic and atraumatic.  Cardiovascular:     Rate and Rhythm: Normal  rate and regular rhythm.  Pulmonary:     Effort: Pulmonary effort is normal.     Breath sounds: Normal breath sounds. No wheezing.  Musculoskeletal:     Right shoulder: He exhibits decreased range of motion, tenderness, swelling and pain. He exhibits no deformity and normal strength.  Skin:    General: Skin is warm and dry.     Findings: No bruising, erythema, lesion or rash.  Neurological:     General: No focal deficit present.     Mental Status: He is alert and oriented to person, place, and time.     Sensory: No sensory deficit.     Motor: No weakness.  Psychiatric:        Mood and Affect: Mood normal.        Behavior: Behavior normal.        Thought Content: Thought content normal.          Assessment   1. Pain of right upper extremity       Tests ordered No orders of the defined types were  placed in this encounter.    Plan: Please see assessment and plan per problem list below.   Meds ordered this encounter  Medications  . cyclobenzaprine (FLEXERIL) 5 MG tablet    Sig: Take 1 tablet (5 mg total) by mouth at bedtime.    Dispense:  10 tablet    Refill:  1    Order Specific Question:   Supervising Provider    Answer:   Doree Albee [[id number]]    Patient to follow-up in May as scheduled.  Ailene Ards, NP

## 2019-05-15 NOTE — Telephone Encounter (Signed)
Please schedule for him to be seen in the office as we need to look at this arm.  We need to see him today or tomorrow.

## 2019-05-28 ENCOUNTER — Ambulatory Visit (INDEPENDENT_AMBULATORY_CARE_PROVIDER_SITE_OTHER): Payer: 59 | Admitting: Internal Medicine

## 2019-05-28 ENCOUNTER — Other Ambulatory Visit: Payer: Self-pay

## 2019-05-28 ENCOUNTER — Encounter (INDEPENDENT_AMBULATORY_CARE_PROVIDER_SITE_OTHER): Payer: Self-pay | Admitting: Internal Medicine

## 2019-05-28 VITALS — BP 130/90 | HR 72 | Ht 73.0 in | Wt 205.0 lb

## 2019-05-28 DIAGNOSIS — M79601 Pain in right arm: Secondary | ICD-10-CM

## 2019-05-28 MED ORDER — PREDNISONE 20 MG PO TABS: 40.0000 mg | ORAL_TABLET | Freq: Every day | ORAL | 1 refills | Status: DC

## 2019-05-28 NOTE — Progress Notes (Signed)
Metrics: Intervention Frequency ACO  Documented Smoking Status Yearly  Screened one or more times in 24 months  Cessation Counseling or  Active cessation medication Past 24 months  Past 24 months   Guideline developer: UpToDate (See UpToDate for funding source) Date Released: 2014       Wellness Office Visit  Subjective:  Patient ID: Alexander Garza, male    DOB: 28-Feb-1971  Age: 48 y.o. MRN: [id number]  CC: This is an acute visit with right upper arm pain. HPI  He was seen by Judson Roch almost 2 weeks ago with the same symptom.  His symptoms began after he received tetanus vaccine on 04/23/2019.  So it has been about a month since he received the vaccine and since this time, he has had pain in the right upper arm radiating down the arm up to the elbow area.  He now has having weakness in abduction of the right arm.  He has tried cyclobenzaprine and also over-the-counter ibuprofen and Tylenol without any significant success. Past Medical History:  Diagnosis Date  . Acid reflux disease   . Decreased libido 04/23/2019  . GERD (gastroesophageal reflux disease) 04/23/2019  . HLD (hyperlipidemia) 04/23/2019  . Hypercholesteremia   . Vitamin D deficiency disease 04/23/2019      History reviewed. No pertinent family history.  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   Social History   Tobacco Use  . Smoking status: Former Smoker    Quit date: 02/21/2019    Years since quitting: 0.2  . Smokeless tobacco: Never Used  Substance Use Topics  . Alcohol use: Yes    Alcohol/week: 1.0 standard drinks    Types: 1 Cans of beer per week    Current Meds  Medication Sig  . Cholecalciferol (VITAMIN D-3) 125 MCG (5000 UT) TABS Take 2 tablets by mouth daily.  . clotrimazole-betamethasone (LOTRISONE) cream Apply 1 application topically 2 (two) times daily.  . cyclobenzaprine (FLEXERIL) 5 MG tablet Take 1 tablet (5 mg total)  by mouth at bedtime.  Marland Kitchen dexlansoprazole (DEXILANT) 60 MG capsule Take 1 capsule (60 mg total) by mouth daily.  Marland Kitchen testosterone cypionate (DEPOTESTOSTERONE CYPIONATE) 200 MG/ML injection INJECT 0.5ML INTRAMUSCULARLY TWICE WEEKLY.     Objective:   Today's Vitals: BP 130/90   Pulse 72   Ht 6\' 1"  (1.854 m)   Wt 205 lb (93 kg)   BMI 27.05 kg/m  Vitals with BMI 05/28/2019 05/15/2019 04/23/2019  Height 6\' 1"  6\' 1"  6\' 1"   Weight 205 lbs 205 lbs 10 oz 209 lbs 6 oz  BMI 27.05 81.82 99.37  Systolic 169 678 938  Diastolic 90 92 80  Pulse 72 72 64     Physical Exam   On physical exam, he does not have any significant tenderness in the lateral aspect of the right upper arm but there is slight warmth compared to the left side.  Abduction of the right arm is somewhat difficult for him because of pain.   Assessment   1. Pain of right upper extremity       Tests ordered No orders of the defined types were placed in this encounter.    Plan: 1. I am going to try him on some steroids to see if this will help but if it does not within of 4-5 days, he will let me know and I will probably refer him to orthopedics for further evaluation.  I explained possible side  effects of steroids.   Meds ordered this encounter  Medications  . predniSONE (DELTASONE) 20 MG tablet    Sig: Take 2 tablets (40 mg total) by mouth daily with breakfast.    Dispense:  20 tablet    Refill:  1    [name], MD

## 2019-05-29 ENCOUNTER — Ambulatory Visit (INDEPENDENT_AMBULATORY_CARE_PROVIDER_SITE_OTHER): Payer: 59 | Admitting: Internal Medicine

## 2019-07-11 ENCOUNTER — Other Ambulatory Visit (INDEPENDENT_AMBULATORY_CARE_PROVIDER_SITE_OTHER): Payer: Self-pay | Admitting: Internal Medicine

## 2019-08-08 DIAGNOSIS — L738 Other specified follicular disorders: Secondary | ICD-10-CM | POA: Diagnosis not present

## 2019-08-08 DIAGNOSIS — D1721 Benign lipomatous neoplasm of skin and subcutaneous tissue of right arm: Secondary | ICD-10-CM | POA: Diagnosis not present

## 2019-08-08 DIAGNOSIS — L858 Other specified epidermal thickening: Secondary | ICD-10-CM | POA: Diagnosis not present

## 2019-08-08 DIAGNOSIS — D2262 Melanocytic nevi of left upper limb, including shoulder: Secondary | ICD-10-CM | POA: Diagnosis not present

## 2019-09-24 ENCOUNTER — Other Ambulatory Visit (INDEPENDENT_AMBULATORY_CARE_PROVIDER_SITE_OTHER): Payer: Self-pay | Admitting: Internal Medicine

## 2019-09-24 MED ORDER — TESTOSTERONE CYPIONATE 200 MG/ML IM SOLN: INTRAMUSCULAR | 2 refills | Status: DC

## 2019-09-25 ENCOUNTER — Other Ambulatory Visit (INDEPENDENT_AMBULATORY_CARE_PROVIDER_SITE_OTHER): Payer: Self-pay | Admitting: Internal Medicine

## 2019-09-25 ENCOUNTER — Telehealth (INDEPENDENT_AMBULATORY_CARE_PROVIDER_SITE_OTHER): Payer: Self-pay

## 2019-09-25 NOTE — Telephone Encounter (Signed)
Please call in Needles to [facility]

## 2019-09-26 NOTE — Telephone Encounter (Signed)
Return call notified where to [facility]

## 2019-09-26 NOTE — Telephone Encounter (Signed)
Pt LVM that he still has not received his needles. Please advise, and call him

## 2019-10-22 ENCOUNTER — Other Ambulatory Visit (INDEPENDENT_AMBULATORY_CARE_PROVIDER_SITE_OTHER): Payer: Self-pay | Admitting: Internal Medicine

## 2019-10-22 ENCOUNTER — Ambulatory Visit (INDEPENDENT_AMBULATORY_CARE_PROVIDER_SITE_OTHER): Payer: 59 | Admitting: Internal Medicine

## 2019-10-22 ENCOUNTER — Encounter (INDEPENDENT_AMBULATORY_CARE_PROVIDER_SITE_OTHER): Payer: Self-pay | Admitting: Internal Medicine

## 2019-10-22 ENCOUNTER — Other Ambulatory Visit: Payer: Self-pay

## 2019-10-22 VITALS — BP 115/80 | HR 67 | Temp 97.3°F | Ht 73.0 in | Wt 197.0 lb

## 2019-10-22 DIAGNOSIS — E559 Vitamin D deficiency, unspecified: Secondary | ICD-10-CM

## 2019-10-22 DIAGNOSIS — M79601 Pain in right arm: Secondary | ICD-10-CM

## 2019-10-22 DIAGNOSIS — R6882 Decreased libido: Secondary | ICD-10-CM | POA: Diagnosis not present

## 2019-10-22 DIAGNOSIS — E782 Mixed hyperlipidemia: Secondary | ICD-10-CM

## 2019-10-22 DIAGNOSIS — R69 Illness, unspecified: Secondary | ICD-10-CM | POA: Diagnosis not present

## 2019-10-22 MED ORDER — DEXLANSOPRAZOLE 60 MG PO CPDR: 60.0000 mg | DELAYED_RELEASE_CAPSULE | Freq: Every day | ORAL | 1 refills | Status: DC

## 2019-10-22 MED ORDER — CYCLOBENZAPRINE HCL 5 MG PO TABS: 5.0000 mg | ORAL_TABLET | Freq: Every day | ORAL | 1 refills | Status: DC

## 2019-10-22 NOTE — Progress Notes (Signed)
Metrics: Intervention Frequency ACO  Documented Smoking Status Yearly  Screened one or more times in 24 months  Cessation Counseling or  Active cessation medication Past 24 months  Past 24 months   Guideline developer: UpToDate (See UpToDate for funding source) Date Released: 2014       Wellness Office Visit  Subjective:  Patient ID: Alexander Garza, male    DOB: 09-May-1971  Age: 49 y.o. MRN: [id number]  CC: This man comes in for follow-up of testosterone therapy, gastroesophageal reflux disease, vitamin D deficiency and hyperlipidemia. HPI  Overall, he is doing very well.  He does still continue to have right arm pain at the site of injection of tetanus vaccine but physical therapy seems to be helping and he wonders if muscle relaxant would help him as needed. He continues with testosterone therapy twice a week and is tolerating this very well. He also continues with PPI and he does not take it every day, maybe 3-4 times a week depending on what he has eaten that day. Hyperlipidemia has not required statin therapy. He continues to exercise on a regular basis and tries to eat healthy. Past Medical History:  Diagnosis Date  . Acid reflux disease   . Decreased libido 04/23/2019  . GERD (gastroesophageal reflux disease) 04/23/2019  . HLD (hyperlipidemia) 04/23/2019  . Hypercholesteremia   . Vitamin D deficiency disease 04/23/2019      History reviewed. No pertinent family history.  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   Social History   Tobacco Use  . Smoking status: Former Smoker    Quit date: 02/21/2019    Years since quitting: 0.6  . Smokeless tobacco: Never Used  Substance Use Topics  . Alcohol use: Yes    Alcohol/week: 1.0 standard drinks    Types: 1 Cans of beer per week    Current Meds  Medication Sig  . Cholecalciferol (VITAMIN D-3) 125 MCG (5000 UT) TABS Take 2 tablets by mouth  daily.  Marland Kitchen dexlansoprazole (DEXILANT) 60 MG capsule Take 1 capsule (60 mg total) by mouth daily.  Marland Kitchen testosterone cypionate (DEPOTESTOSTERONE CYPIONATE) 200 MG/ML injection INJECT 0.5ML INTRAMUSCULARLY TWICE WEEKLY.  . [DISCONTINUED] clotrimazole-betamethasone (LOTRISONE) cream Apply 1 application topically 2 (two) times daily.  . [DISCONTINUED] dexlansoprazole (DEXILANT) 60 MG capsule Take 1 capsule (60 mg total) by mouth daily.       Bio Identical Hormones  Testosterone therapy is being used off label for symptoms of testosterone deficiency and benefits that it produces based on several studies.  These benefits include decreasing body fat, increasing in lean muscle mass and increasing in bone density.  There is improvement of memory, cognition.  There is improvement in exercise tolerance and endurance.  Testosterone therapy has also been shown to be protective against coronary artery disease, cerebrovascular disease, diabetes, hypertension and degenerative joint disease. I have discussed with the patient the FDA warnings regarding testosterone therapy, benefits and side effects and modes of administration as well as monitoring blood levels and side effects  on a regular basis The patient is agreeable that testosterone therapy should be an integral part of his/her wellness,quality of life and prevention of chronic disease.  Objective:   Today's Vitals: BP 115/80 (BP Location: Left Arm, Patient Position: Sitting, Cuff Size: Normal)   Pulse 67   Temp (!) 97.3 F (36.3 C) (Temporal)   Ht 6\' 1"  (1.854 m)   Wt 197 lb (89.4 kg)  SpO2 97%   BMI 25.99 kg/m  Vitals with BMI 10/22/2019 05/28/2019 05/15/2019  Height 6\' 1"  6\' 1"  6\' 1"   Weight 197 lbs 205 lbs 205 lbs 10 oz  BMI 26 27.05 27.13  Systolic 115 130  Diastolic 80 90 92  Pulse 67 72 72     Physical Exam  He looks systemically well.  He has lost 8 pounds since last visit.  Blood pressure is excellent.     Assessment   1.  Decreased libido   2. Pain of right upper extremity   3. Vitamin D deficiency disease   4. Mixed hyperlipidemia       Tests ordered No orders of the defined types were placed in this encounter.    Plan: 1. I have sent a prescription for cyclobenzaprine for his right arm pain to see if this will help and he will continue to have some physical therapy. 2. He will continue with vitamin D3 10,000 units daily as before. 3. He will continue with testosterone therapy twice a week as before and he is tolerating this very well. 4. I will see him in 6 months time for an annual physical exam when we will do all the blood work.   Meds ordered this encounter  Medications  . cyclobenzaprine (FLEXERIL) 5 MG tablet    Sig: Take 1 tablet (5 mg total) by mouth at bedtime.    Dispense:  30 tablet    Refill:  1  . dexlansoprazole (DEXILANT) 60 MG capsule    Sig: Take 1 capsule (60 mg total) by mouth daily.    Dispense:  90 capsule    Refill:  1    Alexander Garza , MD

## 2019-11-20 ENCOUNTER — Other Ambulatory Visit (INDEPENDENT_AMBULATORY_CARE_PROVIDER_SITE_OTHER): Payer: Self-pay | Admitting: Internal Medicine

## 2019-11-20 MED ORDER — DEXLANSOPRAZOLE 60 MG PO CPDR: 60.0000 mg | DELAYED_RELEASE_CAPSULE | Freq: Every day | ORAL | 1 refills | Status: DC

## 2019-12-24 ENCOUNTER — Other Ambulatory Visit (INDEPENDENT_AMBULATORY_CARE_PROVIDER_SITE_OTHER): Payer: Self-pay | Admitting: Internal Medicine

## 2020-04-23 ENCOUNTER — Encounter (INDEPENDENT_AMBULATORY_CARE_PROVIDER_SITE_OTHER): Payer: 59 | Admitting: Internal Medicine

## 2020-06-28 ENCOUNTER — Other Ambulatory Visit (INDEPENDENT_AMBULATORY_CARE_PROVIDER_SITE_OTHER): Payer: Self-pay | Admitting: Internal Medicine

## 2020-07-21 ENCOUNTER — Encounter (INDEPENDENT_AMBULATORY_CARE_PROVIDER_SITE_OTHER): Payer: 59 | Admitting: Internal Medicine

## 2020-08-03 ENCOUNTER — Other Ambulatory Visit: Payer: Self-pay

## 2020-08-03 ENCOUNTER — Encounter: Payer: Self-pay | Admitting: Emergency Medicine

## 2020-08-03 ENCOUNTER — Ambulatory Visit
Admission: EM | Admit: 2020-08-03 | Discharge: 2020-08-03 | Disposition: A | Discharge status: Home / Self Care | Payer: 59 | Attending: Family Medicine | Admitting: Family Medicine

## 2020-08-03 DIAGNOSIS — N3001 Acute cystitis with hematuria: Secondary | ICD-10-CM | POA: Diagnosis present

## 2020-08-03 LAB — POCT URINALYSIS DIP (MANUAL ENTRY)
Bilirubin, UA: NEGATIVE
Glucose, UA: NEGATIVE mg/dL
Leukocytes, UA: NEGATIVE
Nitrite, UA: NEGATIVE
Protein Ur, POC: 100 mg/dL — AB
Spec Grav, UA: >=1.03 — AB (ref 1.010–1.025)
Urobilinogen, UA: 1 E.U./dL
pH, UA: 6 (ref 5.0–8.0)

## 2020-08-03 MED ORDER — CEPHALEXIN 500 MG PO CAPS: 500.0000 mg | ORAL_CAPSULE | Freq: Two times a day (BID) | ORAL | 0 refills | Status: AC

## 2020-08-03 NOTE — Discharge Instructions (Addendum)
I have sent in Keflex for you to take twice a day for 7 days  You may have a urinary tract infection.   We are going to culture your urine and will call you as soon as we have the results.   Drink plenty of water, 8-10 glasses per day.   You may take AZO over the counter for painful urination.  Follow up with your primary care provider as needed.   Go to the Emergency Department if you experience severe pain, shortness of breath, high fever, or other concerns.   

## 2020-08-03 NOTE — ED Triage Notes (Signed)
Pain across lower bad, pain upon urination and hard to have a bowel movement.

## 2020-08-03 NOTE — ED Provider Notes (Signed)
[facility]   CC: UTI  SUBJECTIVE:  Alexander Garza is a 50 y.o. male who complains of urinary frequency, urgency and dysuria for the past 4 days. Patient denies a precipitating event, recent sexual encounter, excessive caffeine intake. Localizes the pain to the lower abdomen.  Pain is intermittent and describes it as sharp. Has not tried OTC medications without relief. Symptoms are made worse with urination. Admits to similar symptoms in the past. Denies fever, chills, nausea, vomiting, flank pain  ROS: As in HPI.  All other pertinent ROS negative.     Past Medical History:  Diagnosis Date  . Acid reflux disease   . Decreased libido 04/23/2019  . GERD (gastroesophageal reflux disease) 04/23/2019  . HLD (hyperlipidemia) 04/23/2019  . Hypercholesteremia   . Vitamin D deficiency disease 04/23/2019   Past Surgical History:  Procedure Laterality Date  . APPENDECTOMY    . fatty tumor removal     chest, arm and leg   No Known Allergies No current facility-administered medications on file prior to encounter.   Current Outpatient Medications on File Prior to Encounter  Medication Sig Dispense Refill  . Cholecalciferol (VITAMIN D-3) 125 MCG (5000 UT) TABS Take 2 tablets by mouth daily.    . cyclobenzaprine (FLEXERIL) 5 MG tablet Take 1 tablet (5 mg total) by mouth at bedtime. 30 tablet 1  . dexlansoprazole (DEXILANT) 60 MG capsule Take 1 capsule (60 mg total) by mouth daily. 90 capsule 1  . predniSONE (DELTASONE) 20 MG tablet TAKE 2 TABLETS(40 MG) BY MOUTH DAILY WITH BREAKFAST 20 tablet 1  . testosterone cypionate (DEPOTESTOSTERONE CYPIONATE) 200 MG/ML injection INJECT 0.5ML INTRAMUSCULARLY TWICE WEEKLY. 10 mL 1   Social History   Socioeconomic History  . Marital status: Married    Spouse name: Not on file  . Number of children: Not on file  . Years of education: Not on file  . Highest education level: Not on file  Occupational History  . Not on file  Tobacco Use  .  Smoking status: Former Smoker    Quit date: 02/21/2019    Years since quitting: 1.4  . Smokeless tobacco: Never Used  Substance and Sexual Activity  . Alcohol use: Yes    Alcohol/week: 1.0 standard drink    Types: 1 Cans of beer per week  . Drug use: No  . Sexual activity: Not on file  Other Topics Concern  . 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   Social Determinants of Health   Financial Resource Strain: Not on file  Food Insecurity: Not on file  Transportation Needs: Not on file  Physical Activity: Not on file  Stress: Not on file  Social Connections: Not on file  Intimate Partner Violence: Not on file   History reviewed. No pertinent family history.  OBJECTIVE:  Vitals:   08/03/20 0810  BP: 107/64  Pulse: 85  Resp: 18  Temp: 98.2 F (36.8 C)  TempSrc: Oral  SpO2: 96%   General appearance: AOx3 in no acute distress HEENT: NCAT. Oropharynx clear.  Lungs: clear to auscultation bilaterally without adventitious breath sounds Heart: regular rate and rhythm. Radial pulses 2+ symmetrical bilaterally Abdomen: soft; non-distended; no tenderness; bowel sounds present; no guarding or rebound tenderness Back: no CVA tenderness Extremities: no edema; symmetrical with no gross deformities Skin: warm and dry Neurologic: Ambulates from chair to exam table without difficulty Psychological: alert and cooperative; normal mood and affect  Labs Reviewed  POCT URINALYSIS DIP (MANUAL ENTRY) - Abnormal; Notable for the following components:      Result Value   Color, UA orange (*)    Ketones, POC UA trace (5) (*)    Spec Grav, UA >=1.030 (*)    Blood, UA small (*)    Protein Ur, POC =100 (*)    All other components within normal limits  URINE CULTURE    ASSESSMENT & PLAN:  1. Acute cystitis with hematuria     Meds ordered this encounter  Medications  . cephALEXin (KEFLEX) 500 MG capsule     Sig: Take 1 capsule (500 mg total) by mouth 2 (two) times daily for 7 days.    Dispense:  14 capsule    Refill:  0    Order Specific Question:   Supervising Provider    Answer:   Merrilee Jansky [id number]     Urine culture sent We will call you with abnormal results that need further treatment Push fluids and get plenty of rest Keflex prescribed BI x 7 days Take antibiotic as directed and to completion Take pyridium as prescribed and as needed for symptomatic relief Follow up with PCP if symptoms persists Return here or go to ER if you have any new or worsening symptoms such as fever, worsening abdominal pain, nausea/vomiting, flank pain  Outlined signs and symptoms indicating need for more acute intervention Patient verbalized understanding After Visit Summary given     Moshe Cipro, NP 08/03/20 1302

## 2020-08-03 NOTE — ED Notes (Signed)
Patient unable to provide a urine specimen

## 2020-08-04 ENCOUNTER — Telehealth (INDEPENDENT_AMBULATORY_CARE_PROVIDER_SITE_OTHER): Payer: Self-pay | Admitting: Internal Medicine

## 2020-08-04 LAB — URINE CULTURE: Culture: NO GROWTH

## 2020-08-04 NOTE — Telephone Encounter (Signed)
Please call this patient and see what he wants to talk about.  I did see the note when he went to the urgent care yesterday.  Urine culture is actually negative for UTI so this may not be the cause of his symptoms.  Let me know what he wants to talk about.

## 2020-08-06 ENCOUNTER — Other Ambulatory Visit: Payer: Self-pay

## 2020-08-06 ENCOUNTER — Telehealth (INDEPENDENT_AMBULATORY_CARE_PROVIDER_SITE_OTHER): Payer: Self-pay

## 2020-08-06 ENCOUNTER — Encounter (INDEPENDENT_AMBULATORY_CARE_PROVIDER_SITE_OTHER): Payer: Self-pay | Admitting: Internal Medicine

## 2020-08-06 ENCOUNTER — Ambulatory Visit (INDEPENDENT_AMBULATORY_CARE_PROVIDER_SITE_OTHER): Payer: 59 | Admitting: Internal Medicine

## 2020-08-06 VITALS — BP 100/64 | HR 86 | Temp 98.1°F | Ht 73.0 in | Wt 205.6 lb

## 2020-08-06 DIAGNOSIS — N410 Acute prostatitis: Secondary | ICD-10-CM

## 2020-08-06 MED ORDER — TAMSULOSIN HCL 0.4 MG PO CAPS
0.4000 mg | ORAL_CAPSULE | Freq: Every day | ORAL | 0 refills | Status: DC
Start: 2020-08-06 — End: 2021-04-24

## 2020-08-06 MED ORDER — CIPROFLOXACIN HCL 500 MG PO TABS: 500.0000 mg | ORAL_TABLET | Freq: Two times a day (BID) | ORAL | 0 refills | Status: DC

## 2020-08-06 NOTE — Progress Notes (Signed)
Metrics: Intervention Frequency ACO  Documented Smoking Status Yearly  Screened one or more times in 24 months  Cessation Counseling or  Active cessation medication Past 24 months  Past 24 months   Guideline developer: UpToDate (See UpToDate for funding source) Date Released: 2014       Wellness Office Visit  Subjective:  Patient ID: Alexander Garza, male    DOB: [birth year]  Age: 50 y.o. MRN: [id number]  CC: Dysuria, increased frequency of micturition. HPI  The patient describes the above symptoms for the last week and also including lower pelvic discomfort.  He also finds it uncomfortable sitting on a chair. He went to the urgent care and urine culture did not show any abnormalities.  He denies any penile discharge.  He denies any fever. Past Medical History:  Diagnosis Date  . Acid reflux disease   . Decreased libido 04/23/2019  . GERD (gastroesophageal reflux disease) 04/23/2019  . HLD (hyperlipidemia) 04/23/2019  . Hypercholesteremia   . Vitamin D deficiency disease 04/23/2019   Past Surgical History:  Procedure Laterality Date  . APPENDECTOMY    . fatty tumor removal     chest, arm and leg     History reviewed. No pertinent family history.  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   Social History   Tobacco Use  . Smoking status: Former Smoker    Quit date: 02/21/2019    Years since quitting: 1.4  . Smokeless tobacco: Never Used  Substance Use Topics  . Alcohol use: Yes    Alcohol/week: 1.0 standard drink    Types: 1 Cans of beer per week    Current Meds  Medication Sig  . Cholecalciferol (VITAMIN D-3) 125 MCG (5000 UT) TABS Take 2 tablets by mouth daily.  . ciprofloxacin (CIPRO) 500 MG tablet Take 1 tablet (500 mg total) by mouth 2 (two) times daily.  . cyclobenzaprine (FLEXERIL) 5 MG tablet Take 1 tablet (5 mg total) by mouth at bedtime.  Marland Kitchen dexlansoprazole (DEXILANT) 60 MG  capsule Take 1 capsule (60 mg total) by mouth daily.  . tamsulosin (FLOMAX) 0.4 MG CAPS capsule Take 1 capsule (0.4 mg total) by mouth daily.  Marland Kitchen testosterone cypionate (DEPOTESTOSTERONE CYPIONATE) 200 MG/ML injection INJECT 0.5ML INTRAMUSCULARLY TWICE WEEKLY.     Flowsheet Row Office Visit from 08/06/2020 in [facility]  PHQ-9 Total Score 0      Objective:   Today's Vitals: BP 100/64   Pulse 86   Temp 98.1 F (36.7 C) (Temporal)   Ht 6\' 1"  (1.854 m)   Wt 205 lb 9.6 oz (93.3 kg)   SpO2 96%   BMI 27.13 kg/m  Vitals with BMI 08/06/2020 08/03/2020 10/22/2019  Height 6\' 1"  - 6\' 1"   Weight 205 lbs 10 oz - 197 lbs  BMI 27.13 - 26  Systolic 100 107 12/22/2019  Diastolic 64 64 80  Pulse 86 85 67     Physical Exam Looks somewhat uncomfortable sitting in the chair.  He is afebrile.  Blood pressure on the lower end.  He does not look clinically toxic or septic.      Assessment   1. Acute prostatitis       Tests ordered Orders Placed This Encounter  Procedures  . PSA, Total with Reflex to PSA, Free  . Urinalysis w microscopic + reflex cultur     Plan: 1. I think he has acute prostatitis and we will still send  the urine for urinalysis and possible culture and also check a PSA which I anticipate would be elevated.  I will treat him with ciprofloxacin for 10 days.  We will also use tamsulosin to help with urinary flow. 2. Further recommendations will depend on above results and I will see him as scheduled soon.   Meds ordered this encounter  Medications  . ciprofloxacin (CIPRO) 500 MG tablet    Sig: Take 1 tablet (500 mg total) by mouth 2 (two) times daily.    Dispense:  20 tablet    Refill:  0  . tamsulosin (FLOMAX) 0.4 MG CAPS capsule    Sig: Take 1 capsule (0.4 mg total) by mouth daily.    Dispense:  30 capsule    Refill:  0    Ketan Renz Normajean Glasgow, MD

## 2020-08-06 NOTE — Telephone Encounter (Signed)
Patient and patients wife called today and stated that he has called several times since Monday and no one has called him back. Patient stated that he went to urgent care and stated that he is having pain in his groin area and pain with urination. Patient has had symptoms for a week and stated that he did have back pain one day and he is not sure why his other symptoms are not getting better and was told to follow up with PCP. Patients wife is worried about his prostate. I have worked him in for an acute today at 2:45pm with Maralyn Sago. Sending as Lorain Childes.

## 2020-08-06 NOTE — Telephone Encounter (Signed)
Changed appointment to Dr. Karilyn Cota at 3:30pm today as the first appointment was not actually today.

## 2020-08-07 LAB — URINALYSIS W MICROSCOPIC + REFLEX CULTURE
Bacteria, UA: NONE SEEN /HPF
Bilirubin Urine: NEGATIVE
Glucose, UA: NEGATIVE
Hyaline Cast: NONE SEEN /LPF
Ketones, ur: NEGATIVE
Leukocyte Esterase: NEGATIVE
Nitrites, Initial: NEGATIVE
Specific Gravity, Urine: 1.025 (ref 1.001–1.03)
Squamous Epithelial / HPF: NONE SEEN /HPF (ref <=5)
WBC, UA: NONE SEEN /HPF (ref 0–5)
pH: 6 (ref 5.0–8.0)

## 2020-08-07 LAB — NO CULTURE INDICATED

## 2020-08-07 LAB — PSA, TOTAL WITH REFLEX TO PSA, FREE: PSA, Total: 0.4 ng/mL (ref <=4.0)

## 2020-08-20 ENCOUNTER — Ambulatory Visit (INDEPENDENT_AMBULATORY_CARE_PROVIDER_SITE_OTHER): Payer: 59 | Admitting: Nurse Practitioner

## 2020-08-21 ENCOUNTER — Other Ambulatory Visit: Payer: Self-pay

## 2020-08-21 ENCOUNTER — Encounter (INDEPENDENT_AMBULATORY_CARE_PROVIDER_SITE_OTHER): Payer: Self-pay | Admitting: Internal Medicine

## 2020-08-21 ENCOUNTER — Ambulatory Visit (INDEPENDENT_AMBULATORY_CARE_PROVIDER_SITE_OTHER): Payer: 59 | Admitting: Internal Medicine

## 2020-08-21 VITALS — BP 112/68 | HR 58 | Temp 97.5°F | Ht 71.5 in | Wt 207.4 lb

## 2020-08-21 DIAGNOSIS — E782 Mixed hyperlipidemia: Secondary | ICD-10-CM

## 2020-08-21 DIAGNOSIS — K219 Gastro-esophageal reflux disease without esophagitis: Secondary | ICD-10-CM | POA: Diagnosis not present

## 2020-08-21 DIAGNOSIS — R6882 Decreased libido: Secondary | ICD-10-CM | POA: Diagnosis not present

## 2020-08-21 DIAGNOSIS — Z131 Encounter for screening for diabetes mellitus: Secondary | ICD-10-CM

## 2020-08-21 DIAGNOSIS — Z0001 Encounter for general adult medical examination with abnormal findings: Secondary | ICD-10-CM

## 2020-08-21 DIAGNOSIS — E559 Vitamin D deficiency, unspecified: Secondary | ICD-10-CM

## 2020-08-21 MED ORDER — TESTOSTERONE CYPIONATE 200 MG/ML IM SOLN: 100.0000 mg | INTRAMUSCULAR | 2 refills | Status: DC

## 2020-08-21 NOTE — Progress Notes (Signed)
Chief Complaint: This 50 year old man comes in for an annual physical exam. HPI: He is doing well.  He had prostatitis and was treated with a combination of ciprofloxacin and tamsulosin.  He has now 90% improved according to him and he stopped taking tamsulosin 2 days ago and finished a course of ciprofloxacin. Otherwise, he continues on testosterone therapy twice a week and he feels well on the present dose.  He last took an injection yesterday. He also has gastroesophageal reflux disease and takes Dexilant.  Past Medical History:  Diagnosis Date  . Acid reflux disease   . Decreased libido 04/23/2019  . GERD (gastroesophageal reflux disease) 04/23/2019  . HLD (hyperlipidemia) 04/23/2019  . Hypercholesteremia   . Vitamin D deficiency disease 04/23/2019   Past Surgical History:  Procedure Laterality Date  . APPENDECTOMY    . 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    Social History   Tobacco Use  . Smoking status: Former Smoker    Quit date: 02/21/2019    Years since quitting: 1.4  . Smokeless tobacco: Never Used  Substance Use Topics  . Alcohol use: Yes    Alcohol/week: 1.0 standard drink    Types: 1 Cans of beer per week      Allergies: No Known Allergies   Current Meds  Medication Sig  . Cholecalciferol (VITAMIN D-3) 125 MCG (5000 UT) TABS Take 2 tablets by mouth daily.  . cyclobenzaprine (FLEXERIL) 5 MG tablet Take 1 tablet (5 mg total) by mouth at bedtime.  Marland Kitchen dexlansoprazole (DEXILANT) 60 MG capsule Take 1 capsule (60 mg total) by mouth daily.  . tamsulosin (FLOMAX) 0.4 MG CAPS capsule Take 1 capsule (0.4 mg total) by mouth daily.  . [DISCONTINUED] ciprofloxacin (CIPRO) 500 MG tablet Take 1 tablet (500 mg total) by mouth 2 (two) times daily.  . [DISCONTINUED] testosterone cypionate (DEPOTESTOSTERONE CYPIONATE) 200 MG/ML injection INJECT  0.5ML INTRAMUSCULARLY TWICE WEEKLY.     Flowsheet Row Office Visit from 08/06/2020 in [facility]  PHQ-9 Total Score 0      NTZ:GYFVC from the symptoms mentioned above,there are no other symptoms referable to all systems reviewed.       Physical Exam: Blood pressure 112/68, pulse (!) 58, temperature (!) 97.5 F (36.4 C), temperature source Temporal, height 5' 11.5" (1.816 m), weight 207 lb 6.4 oz (94.1 kg), SpO2 93 %. Vitals with BMI 08/21/2020 08/06/2020 08/03/2020  Height 5' 11.5" 6\' 1"  -  Weight 207 lbs 6 oz 205 lbs 10 oz -  BMI 28.53 27.13 -  Systolic 112 100  Diastolic 68 64 64  Pulse 58 86 85      He looks systemically well.  Weight is stable.  Blood pressure is excellent. General: Alert, cooperative, and appears to be the stated age.No pallor.  No jaundice.  No clubbing. Head: Normocephalic Eyes: Sclera white, pupils equal and reactive to light, red reflex x 2,  Ears: Normal bilaterally Oral cavity: Lips, mucosa, and tongue normal: Teeth and gums normal Neck: No adenopathy, supple, symmetrical, trachea midline, and thyroid does not appear enlarged Respiratory: Clear to auscultation bilaterally.No wheezing, crackles or bronchial breathing. Cardiovascular: Heart sounds are present and appear to be normal without murmurs or added sounds.  No carotid bruits.  Peripheral pulses are present and equal bilaterally.: Gastrointestinal:positive bowel sounds, no hepatosplenomegaly.  No masses  felt.No tenderness. Skin: Clear, No rashes noted.No worrisome skin lesions seen. Neurological: Grossly intact without focal findings, cranial nerves II through XII intact, muscle strength equal bilaterally Musculoskeletal: No acute joint abnormalities noted.Full range of movement noted with joints. Psychiatric: Affect appropriate, non-anxious.    Assessment  1. Encounter for general adult medical examination with abnormal findings   2. Decreased libido   3. Mixed  hyperlipidemia   4. Gastroesophageal reflux disease without esophagitis   5. Screening for diabetes mellitus   6. Vitamin D deficiency disease     Tests Ordered:   Orders Placed This Encounter  Procedures  . CBC  . COMPLETE METABOLIC PANEL WITH GFR  . Hemoglobin A1c  . Lipid panel  . T3, free  . T4, free  . TSH  . VITAMIN D 25 Hydroxy (Vit-D Deficiency, Fractures)  . Testosterone Total,Free,Bio, Males     Plan  1. Relatively healthy 50 year old man on testosterone therapy. 2. Blood work is ordered. 3. Further recommendations will depend on blood results and I will see him in 6 months time for follow-up and we will discuss screening colonoscopy.     Meds ordered this encounter  Medications  . testosterone cypionate (DEPOTESTOSTERONE CYPIONATE) 200 MG/ML injection    Sig: Inject 0.5 mLs (100 mg total) into the muscle 2 (two) times a week.    Dispense:  10 mL    Refill:  2     Nimish C Gosrani   08/21/2020, 8:25 AM

## 2020-08-22 LAB — COMPLETE METABOLIC PANEL WITH GFR
AG Ratio: 1.6 (calc) (ref 1.0–2.5)
ALT: 34 U/L (ref 9–46)
AST: 19 U/L (ref 10–40)
Albumin: 4 g/dL (ref 3.6–5.1)
Alkaline phosphatase (APISO): 58 U/L (ref 36–130)
BUN: 17 mg/dL (ref 7–25)
CO2: 27 mmol/L (ref 20–32)
Calcium: 9.1 mg/dL (ref 8.6–10.3)
Chloride: 104 mmol/L (ref 98–110)
Creat: 1.06 mg/dL (ref 0.60–1.35)
GFR, Est African American: 95 mL/min/{1.73_m2} (ref >=60)
GFR, Est Non African American: 82 mL/min/{1.73_m2} (ref >=60)
Globulin: 2.5 g/dL (calc) (ref 1.9–3.7)
Glucose, Bld: 99 mg/dL (ref 65–99)
Potassium: 4.7 mmol/L (ref 3.5–5.3)
Sodium: 137 mmol/L (ref 135–146)
Total Bilirubin: 0.6 mg/dL (ref 0.2–1.2)
Total Protein: 6.5 g/dL (ref 6.1–8.1)

## 2020-08-22 LAB — CBC
HCT: 52.4 % — ABNORMAL HIGH (ref 38.5–50.0)
Hemoglobin: 17.9 g/dL — ABNORMAL HIGH (ref 13.2–17.1)
MCH: 32.1 pg (ref 27.0–33.0)
MCHC: 34.2 g/dL (ref 32.0–36.0)
MCV: 94.1 fL (ref 80.0–100.0)
MPV: 10.6 fL (ref 7.5–12.5)
Platelets: 274 10*3/uL (ref 140–400)
RBC: 5.57 10*6/uL (ref 4.20–5.80)
RDW: 12.8 % (ref 11.0–15.0)
WBC: 8.2 10*3/uL (ref 3.8–10.8)

## 2020-08-22 LAB — TESTOSTERONE TOTAL,FREE,BIO, MALES
Albumin: 4 g/dL (ref 3.6–5.1)
Sex Hormone Binding: 24 nmol/L (ref 10–50)
Testosterone, Bioavailable: 646.5 ng/dL — ABNORMAL HIGH (ref >=110.0)
Testosterone, Free: 351.5 pg/mL — ABNORMAL HIGH (ref 46.0–224.0)
Testosterone: 1407 ng/dL — ABNORMAL HIGH (ref 250–827)

## 2020-08-22 LAB — LIPID PANEL
Cholesterol: 237 mg/dL — ABNORMAL HIGH (ref <200)
HDL: 46 mg/dL (ref >=40)
LDL Cholesterol (Calc): 164 mg/dL (calc) — ABNORMAL HIGH
Non-HDL Cholesterol (Calc): 191 mg/dL (calc) — ABNORMAL HIGH (ref <130)
Total CHOL/HDL Ratio: 5.2 (calc) — ABNORMAL HIGH (ref <5.0)
Triglycerides: 136 mg/dL (ref <150)

## 2020-08-22 LAB — T3, FREE: T3, Free: 4 pg/mL (ref 2.3–4.2)

## 2020-08-22 LAB — TSH: TSH: 1.36 mIU/L (ref 0.40–4.50)

## 2020-08-22 LAB — T4, FREE: Free T4: 1.1 ng/dL (ref 0.8–1.8)

## 2020-08-22 LAB — VITAMIN D 25 HYDROXY (VIT D DEFICIENCY, FRACTURES): Vit D, 25-Hydroxy: 77 ng/mL (ref 30–100)

## 2020-08-22 LAB — HEMOGLOBIN A1C
Hgb A1c MFr Bld: 5.6 % of total Hgb (ref <5.7)
Mean Plasma Glucose: 114 mg/dL
eAG (mmol/L): 6.3 mmol/L

## 2020-09-02 ENCOUNTER — Telehealth (INDEPENDENT_AMBULATORY_CARE_PROVIDER_SITE_OTHER): Payer: Self-pay

## 2020-09-02 NOTE — Telephone Encounter (Signed)
Do I need to do anything about this?

## 2020-09-02 NOTE — Telephone Encounter (Signed)
Yes you will have to call them unfortunately. Per the Alexander Garza ins rep.

## 2020-09-02 NOTE — Telephone Encounter (Signed)
Return call to Alexander Garza. He said long as he was not in any  danger and it ws safe to you he  can discuss with you on next office visit.

## 2020-09-02 NOTE — Telephone Encounter (Signed)
Wife Tammy Called and asked if Dr.Gosrani will call back to the life insurance co to finish the last portion of the insurance policy being created They have a few more medical questions to ask of him today. Pt wife, left contact information & policy # to link to the process.  Below is : Policy# [id number], ph: [phone].

## 2020-09-02 NOTE — Telephone Encounter (Signed)
His testosterone level is elevated because he is on testosterone so there is no problem as far as I am concerned.

## 2020-09-03 NOTE — Telephone Encounter (Signed)
Please let the patient's wife know that I just spoke with the insurance company and they said that they have received all the information/medical records and have forwarded on so they do not need to verify any information from me.

## 2020-09-03 NOTE — Telephone Encounter (Signed)
Called Mr Rodderick . I let him know the information. A message was left on the voicemail with detail information from Dr Karilyn Cota.

## 2020-10-14 ENCOUNTER — Other Ambulatory Visit (INDEPENDENT_AMBULATORY_CARE_PROVIDER_SITE_OTHER): Payer: Self-pay | Admitting: Internal Medicine

## 2020-10-14 ENCOUNTER — Telehealth (INDEPENDENT_AMBULATORY_CARE_PROVIDER_SITE_OTHER): Payer: Self-pay

## 2020-10-14 MED ORDER — DEXLANSOPRAZOLE 60 MG PO CPDR: 60.0000 mg | DELAYED_RELEASE_CAPSULE | Freq: Every day | ORAL | 1 refills | Status: DC

## 2020-10-14 NOTE — Telephone Encounter (Signed)
[facility] called and requested a refill be sent to them at the [address] zip code in [place] of the following medication:  dexlansoprazole (DEXILANT) 60 MG capsule  Last filled 11/20/2019, # 90 with 1 refill  Send to new pharmacy per insurance Rx [place] [address]  Last OV 08/21/2020  Next OV 02/24/2021

## 2021-01-23 ENCOUNTER — Other Ambulatory Visit: Payer: Self-pay

## 2021-01-23 ENCOUNTER — Ambulatory Visit
Admission: EM | Admit: 2021-01-23 | Discharge: 2021-01-23 | Disposition: A | Discharge status: Home / Self Care | Payer: 59 | Attending: Emergency Medicine | Admitting: Emergency Medicine

## 2021-01-23 DIAGNOSIS — R0981 Nasal congestion: Secondary | ICD-10-CM

## 2021-01-23 DIAGNOSIS — J0140 Acute pansinusitis, unspecified: Secondary | ICD-10-CM

## 2021-01-23 MED ORDER — ALBUTEROL SULFATE HFA 108 (90 BASE) MCG/ACT IN AERS: 1.0000 | INHALATION_SPRAY | Freq: Four times a day (QID) | RESPIRATORY_TRACT | 0 refills | Status: DC | PRN

## 2021-01-23 MED ORDER — AMOXICILLIN-POT CLAVULANATE 875-125 MG PO TABS: 1.0000 | ORAL_TABLET | Freq: Two times a day (BID) | ORAL | 0 refills | Status: AC

## 2021-01-23 NOTE — ED Triage Notes (Signed)
Pt presents with nasal congestion facial pain and productive cough for almost 2 weeks

## 2021-01-23 NOTE — ED Provider Notes (Signed)
[facility]   [id number] 01/23/21 Arrival Time: 0844   CC: Sinus congestion  SUBJECTIVE: History from: patient.  Alexander Garza is a 50 y.o. male who presents with nasal congestion, facial pain, and productive cough x 2 weeks  Denies sick exposure to COVID, flu or strep.  Has tried OTC medications without relief.  Denies aggravating factors.  Reports previous symptoms in the past with sinus infection.   Denies fever, chills, SOB, wheezing, chest pain, nausea, changes in bowel or bladder habits.    ROS: As per HPI.  All other pertinent ROS negative.     Past Medical History:  Diagnosis Date   Acid reflux disease    Decreased libido 04/23/2019   GERD (gastroesophageal reflux disease) 04/23/2019   HLD (hyperlipidemia) 04/23/2019   Hypercholesteremia    Vitamin D deficiency disease 04/23/2019   Past Surgical History:  Procedure Laterality Date   APPENDECTOMY     fatty tumor removal     chest, arm and leg   No Known Allergies No current facility-administered medications on file prior to encounter.   Current Outpatient Medications on File Prior to Encounter  Medication Sig Dispense Refill   Cholecalciferol (VITAMIN D-3) 125 MCG (5000 UT) TABS Take 2 tablets by mouth daily.     dexlansoprazole (DEXILANT) 60 MG capsule Take 1 capsule (60 mg total) by mouth daily. 90 capsule 1   tamsulosin (FLOMAX) 0.4 MG CAPS capsule Take 1 capsule (0.4 mg total) by mouth daily. 30 capsule 0   testosterone cypionate (DEPOTESTOSTERONE CYPIONATE) 200 MG/ML injection Inject 0.5 mLs (100 mg total) into the muscle 2 (two) times a week. 10 mL 2   Social History   Socioeconomic History   Marital status: Married    Spouse name: Not on file   Number of children: Not on file   Years of education: Not on file   Highest education level: Not on file  Occupational History   Not on file  Tobacco Use   Smoking status: Former    Types: Cigarettes    Quit date: 02/21/2019    Years since quitting: 1.9    Smokeless tobacco: Never  Vaping Use   Vaping Use: Never used  Substance and Sexual Activity   Alcohol use: Yes    Alcohol/week: 1.0 standard drink    Types: 1 Cans of beer per week   Drug use: No   Sexual activity: Yes  Other Topics Concern   Not on file  Social History Narrative   Married for 15 years,second.Lives with wife.Owns business remodelling homes.   Social Determinants of Health   Financial Resource Strain: Not on file  Food Insecurity: Not on file  Transportation Needs: Not on file  Physical Activity: Not on file  Stress: Not on file  Social Connections: Not on file  Intimate Partner Violence: Not on file   Family History  Problem Relation Age of Onset   COPD Mother    Lung cancer Father     OBJECTIVE:  Vitals:   01/23/21 0859  BP: 121/78  Pulse: 73  Resp: 20  Temp: 98.4 F (36.9 C)  SpO2: 96%     General appearance: alert; appears fatigued, but nontoxic; speaking in full sentences and tolerating own secretions HEENT: NCAT; Ears: EACs clear, TMs pearly gray; Eyes: PERRL.  EOM grossly intact. Nose: nares patent without rhinorrhea, Throat: oropharynx clear, tonsils non erythematous or enlarged, uvula midline  Neck: supple without LAD Lungs: unlabored respirations, symmetrical air entry; cough: mild; no respiratory  distress; CTAB Heart: regular rate and rhythm.   Skin: warm and dry Psychological: alert and cooperative; normal mood and affect   ASSESSMENT & PLAN:  1. Sinus congestion   2. Acute non-recurrent pansinusitis     Meds ordered this encounter  Medications   amoxicillin-clavulanate (AUGMENTIN) 875-125 MG tablet    Sig: Take 1 tablet by mouth every 12 (twelve) hours for 10 days.    Dispense:  20 tablet    Refill:  0    Order Specific Question:   Supervising Provider    Answer:   Eustace Moore [[id number]]   albuterol (VENTOLIN HFA) 108 (90 Base) MCG/ACT inhaler    Sig: Inhale 1-2 puffs into the lungs every 6 (six) hours as needed  for wheezing or shortness of breath.    Dispense:  18 g    Refill:  0    Order Specific Question:   Supervising Provider    Answer:   Eustace Moore [[id number]]    Rest and push fluids Augmentin prescribed.  Take as directed and to completion Continue with OTC ibuprofen/tylenol as needed for pain Follow up with PCP as needed Return or go to the ED if you have any new or worsening symptoms such as fever, chills, worsening sinus pain/pressure, cough, sore throat, chest pain, shortness of breath, abdominal pain, changes in bowel or bladder habits, etc...   Albuterol inhaler refilled  Reviewed expectations re: course of current medical issues. Questions answered. Outlined signs and symptoms indicating need for more acute intervention. Patient verbalized understanding. After Visit Summary given.          Rennis Harding, PA-C 01/23/21 [phone]

## 2021-01-23 NOTE — Discharge Instructions (Signed)
Rest and push fluids Augmentin prescribed.  Take as directed and to completion Continue with OTC ibuprofen/tylenol as needed for pain Follow up with PCP as needed Return or go to the ED if you have any new or worsening symptoms such as fever, chills, worsening sinus pain/pressure, cough, sore throat, chest pain, shortness of breath, abdominal pain, changes in bowel or bladder habits, etc...   Albuterol inhaler refilled

## 2021-02-24 ENCOUNTER — Ambulatory Visit (INDEPENDENT_AMBULATORY_CARE_PROVIDER_SITE_OTHER): Payer: 59 | Admitting: Internal Medicine

## 2021-04-24 ENCOUNTER — Ambulatory Visit
Admission: EM | Admit: 2021-04-24 | Discharge: 2021-04-24 | Disposition: A | Discharge status: Home / Self Care | Payer: 59 | Attending: Emergency Medicine | Admitting: Emergency Medicine

## 2021-04-24 ENCOUNTER — Other Ambulatory Visit: Payer: Self-pay

## 2021-04-24 ENCOUNTER — Encounter: Payer: Self-pay | Admitting: Emergency Medicine

## 2021-04-24 DIAGNOSIS — R350 Frequency of micturition: Secondary | ICD-10-CM | POA: Insufficient documentation

## 2021-04-24 DIAGNOSIS — N401 Enlarged prostate with lower urinary tract symptoms: Secondary | ICD-10-CM | POA: Insufficient documentation

## 2021-04-24 DIAGNOSIS — R3915 Urgency of urination: Secondary | ICD-10-CM | POA: Insufficient documentation

## 2021-04-24 LAB — POCT URINALYSIS DIP (MANUAL ENTRY)
Bilirubin, UA: NEGATIVE
Blood, UA: NEGATIVE
Glucose, UA: NEGATIVE mg/dL
Ketones, POC UA: NEGATIVE mg/dL
Leukocytes, UA: NEGATIVE
Nitrite, UA: NEGATIVE
Protein Ur, POC: NEGATIVE mg/dL
Spec Grav, UA: 1.02 (ref 1.010–1.025)
Urobilinogen, UA: 0.2 E.U./dL
pH, UA: 6.5 (ref 5.0–8.0)

## 2021-04-24 MED ORDER — TAMSULOSIN HCL 0.4 MG PO CAPS: 0.4000 mg | ORAL_CAPSULE | Freq: Every day | ORAL | 0 refills | Status: DC

## 2021-04-24 NOTE — ED Triage Notes (Signed)
Pt is present today urinary pressure, nausea, urinary urgency, patient states that the feels an ache when he urinates, no dysuria. Pt sx started one week ago

## 2021-04-24 NOTE — ED Provider Notes (Signed)
HPI  SUBJECTIVE:  Alexander Garza is a 50 y.o. male who presents with urinary urgency, frequency, a weak urinary stream, urinating small amounts at a time for the past week.  He reports nausea when he holds his urine, constant pelvic/suprapubic pain and pressure that is worse before urination and better afterwards.  He states that he is able to completely empty his bladder.  No dysuria, cloudy or odorous urine, hematuria.  No vomiting, fevers, abdominal pain, swelling, back, perineal pain or swelling.  He had similar, but worse symptoms 1 year ago was and originally thought to have a UTI, eventually found to have prostatitis which was treated with Bactrim or Cipro.  No antibiotics in the past month. No Antipyretic in the past 6 hours.  He has tried cranberry juice, apple cider vinegar and increasing fluids with improvement in his symptoms.  No aggravating factors.  No penile rash, discharge.  He is in a long-term monogamous relationship with his wife, who is asymptomatic.  STDs are not a concern today.  He has a past medical history of prostatitis, UTI.  No history of BPH.  PMD: None.   Past Medical History:  Diagnosis Date   Acid reflux disease    Decreased libido 04/23/2019   GERD (gastroesophageal reflux disease) 04/23/2019   HLD (hyperlipidemia) 04/23/2019   Hypercholesteremia    Vitamin D deficiency disease 04/23/2019    Past Surgical History:  Procedure Laterality Date   APPENDECTOMY     fatty tumor removal     chest, arm and leg    Family History  Problem Relation Age of Onset   COPD Mother    Lung cancer Father     Social History   Tobacco Use   Smoking status: Former    Types: Cigarettes    Quit date: 02/21/2019    Years since quitting: 2.1   Smokeless tobacco: Never  Vaping Use   Vaping Use: Never used  Substance Use Topics   Alcohol use: Yes    Alcohol/week: 1.0 standard drink    Types: 1 Cans of beer per week   Drug use: No    No current facility-administered  medications for this encounter.  Current Outpatient Medications:    albuterol (VENTOLIN HFA) 108 (90 Base) MCG/ACT inhaler, Inhale 1-2 puffs into the lungs every 6 (six) hours as needed for wheezing or shortness of breath., Disp: 18 g, Rfl: 0   Cholecalciferol (VITAMIN D-3) 125 MCG (5000 UT) TABS, Take 2 tablets by mouth daily., Disp: , Rfl:    dexlansoprazole (DEXILANT) 60 MG capsule, Take 1 capsule (60 mg total) by mouth daily., Disp: 90 capsule, Rfl: 1   tamsulosin (FLOMAX) 0.4 MG CAPS capsule, Take 1 capsule (0.4 mg total) by mouth daily., Disp: 30 capsule, Rfl: 0   testosterone cypionate (DEPOTESTOSTERONE CYPIONATE) 200 MG/ML injection, Inject 0.5 mLs (100 mg total) into the muscle 2 (two) times a week., Disp: 10 mL, Rfl: 2  No Known Allergies   ROS  As noted in HPI.   Physical Exam  BP (!) 145/87   Pulse 68   Temp 97.8 F (36.6 C)   Resp 18   SpO2 97%   Constitutional: Well developed, well nourished, no acute distress Eyes:  EOMI, conjunctiva normal bilaterally HENT: Normocephalic, atraumatic,mucus membranes moist Respiratory: Normal inspiratory effort Cardiovascular: Normal rate GI: nondistended soft, nontender, no palpable masses.  No suprapubic, flank tenderness. GU: Normal circumcised male, testes descended bilaterally.  No testicular, epididymal, scrotal tenderness, swelling. Rectal: Firm, enlarged, smooth  prostate.  No bogginess, tenderness. skin: No rash, skin intact Musculoskeletal: no deformities Neurologic: Alert & oriented x 3, no focal neuro deficits Psychiatric: Speech and behavior appropriate   ED Course   Medications - No data to display  Orders Placed This Encounter  Procedures   Urine Culture    Standing Status:   Standing    Number of Occurrences:   1    Order Specific Question:   Indication    Answer:   Dysuria   POCT urinalysis dipstick    Standing Status:   Standing    Number of Occurrences:   1    Results for orders placed or  performed during the hospital encounter of 04/24/21 (from the past 24 hour(s))  POCT urinalysis dipstick     Status: None   Collection Time: 04/24/21 10:36 AM  Result Value Ref Range   Color, UA yellow yellow   Clarity, UA clear clear   Glucose, UA negative negative mg/dL   Bilirubin, UA negative negative   Ketones, POC UA negative negative mg/dL   Spec Grav, UA 1.020 1.010 - 1.025   Blood, UA negative negative   pH, UA 6.5 5.0 - 8.0   Protein Ur, POC negative negative mg/dL   Urobilinogen, UA 0.2 0.2 or 1.0 E.U./dL   Nitrite, UA Negative Negative   Leukocytes, UA Negative Negative   No results found.  ED Clinical Impression  1. Urinary urgency   2. Benign prostatic hyperplasia with urinary frequency      ED Assessment/Plan  UA negative for UTI, however will send off for culture to confirm absence of urinary tract infection.  We will contact patient if it comes back positive for infection we will call in the appropriate antibiotics at that time.  I suspect that patient has BPH causing his symptoms.  Starting on Flomax.  There is no evidence of a prostatitis at this time, deferring antibiotic therapy. discussed this with patient, he is amenable to this.  follow-up with [facility] as needed, will order assistance in finding a PMD and will also have him follow-up with a urologist.   Discussed labs,  MDM, treatment plan, and plan for follow-up with patient. Discussed sn/sx that should prompt return to the ED. patient agrees with plan.   Meds ordered this encounter  Medications   tamsulosin (FLOMAX) 0.4 MG CAPS capsule    Sig: Take 1 capsule (0.4 mg total) by mouth daily.    Dispense:  30 capsule    Refill:  0      *This clinic note was created using [employer]. Therefore, there may be occasional mistakes despite careful proofreading.  ?    Melynda Ripple, MD 04/26/21 [phone]

## 2021-04-24 NOTE — Discharge Instructions (Signed)
I suspect your symptoms are due from an enlarged prostate.  Try the Flomax.  Return here if you get worse and we can evaluate for prostate infection at that time

## 2021-04-26 LAB — URINE CULTURE: Culture: NO GROWTH

## 2021-05-01 ENCOUNTER — Ambulatory Visit (INDEPENDENT_AMBULATORY_CARE_PROVIDER_SITE_OTHER): Payer: 59 | Admitting: Physician Assistant

## 2021-05-01 ENCOUNTER — Encounter: Payer: Self-pay | Admitting: Physician Assistant

## 2021-05-01 ENCOUNTER — Other Ambulatory Visit: Payer: Self-pay

## 2021-05-01 VITALS — BP 127/72 | HR 61 | Temp 98.4°F | Ht 71.5 in | Wt 193.4 lb

## 2021-05-01 DIAGNOSIS — G8929 Other chronic pain: Secondary | ICD-10-CM

## 2021-05-01 DIAGNOSIS — N410 Acute prostatitis: Secondary | ICD-10-CM | POA: Diagnosis not present

## 2021-05-01 DIAGNOSIS — E782 Mixed hyperlipidemia: Secondary | ICD-10-CM

## 2021-05-01 DIAGNOSIS — F17210 Nicotine dependence, cigarettes, uncomplicated: Secondary | ICD-10-CM | POA: Diagnosis not present

## 2021-05-01 DIAGNOSIS — M545 Low back pain, unspecified: Secondary | ICD-10-CM

## 2021-05-01 DIAGNOSIS — K219 Gastro-esophageal reflux disease without esophagitis: Secondary | ICD-10-CM

## 2021-05-01 DIAGNOSIS — Z0000 Encounter for general adult medical examination without abnormal findings: Secondary | ICD-10-CM

## 2021-05-01 DIAGNOSIS — R7989 Other specified abnormal findings of blood chemistry: Secondary | ICD-10-CM | POA: Diagnosis not present

## 2021-05-01 LAB — COMPREHENSIVE METABOLIC PANEL
ALT: 27 U/L (ref 0–53)
AST: 22 U/L (ref 0–37)
Albumin: 4.3 g/dL (ref 3.5–5.2)
Alkaline Phosphatase: 71 U/L (ref 39–117)
BUN: 16 mg/dL (ref 6–23)
CO2: 27 mEq/L (ref 19–32)
Calcium: 9.3 mg/dL (ref 8.4–10.5)
Chloride: 102 mEq/L (ref 96–112)
Creatinine, Ser: 0.99 mg/dL (ref 0.40–1.50)
GFR: 88.84 mL/min (ref >60.00)
Glucose, Bld: 87 mg/dL (ref 70–99)
Potassium: 4.3 mEq/L (ref 3.5–5.1)
Sodium: 138 mEq/L (ref 135–145)
Total Bilirubin: 0.6 mg/dL (ref 0.2–1.2)
Total Protein: 6.9 g/dL (ref 6.0–8.3)

## 2021-05-01 LAB — LIPID PANEL
Cholesterol: 247 mg/dL — ABNORMAL HIGH (ref 0–200)
HDL: 43.6 mg/dL (ref >39.00)
LDL Cholesterol: 184 mg/dL — ABNORMAL HIGH (ref 0–99)
NonHDL: 203.11
Total CHOL/HDL Ratio: 6
Triglycerides: 95 mg/dL (ref 0.0–149.0)
VLDL: 19 mg/dL (ref 0.0–40.0)

## 2021-05-01 LAB — TESTOSTERONE: Testosterone: 236.12 ng/dL — ABNORMAL LOW (ref 300.00–890.00)

## 2021-05-01 LAB — CBC WITH DIFFERENTIAL/PLATELET
Basophils Absolute: 0 10*3/uL (ref 0.0–0.1)
Basophils Relative: 0.7 % (ref 0.0–3.0)
Eosinophils Absolute: 0.1 10*3/uL (ref 0.0–0.7)
Eosinophils Relative: 1.1 % (ref 0.0–5.0)
HCT: 50.8 % (ref 39.0–52.0)
Hemoglobin: 17.2 g/dL — ABNORMAL HIGH (ref 13.0–17.0)
Lymphocytes Relative: 30.5 % (ref 12.0–46.0)
Lymphs Abs: 2 10*3/uL (ref 0.7–4.0)
MCHC: 33.9 g/dL (ref 30.0–36.0)
MCV: 94.8 fl (ref 78.0–100.0)
Monocytes Absolute: 0.5 10*3/uL (ref 0.1–1.0)
Monocytes Relative: 8 % (ref 3.0–12.0)
Neutro Abs: 4 10*3/uL (ref 1.4–7.7)
Neutrophils Relative %: 59.7 % (ref 43.0–77.0)
Platelets: 199 10*3/uL (ref 150.0–400.0)
RBC: 5.36 Mil/uL (ref 4.22–5.81)
RDW: 13.7 % (ref 11.5–15.5)
WBC: 6.7 10*3/uL (ref 4.0–10.5)

## 2021-05-01 LAB — PSA: PSA: 0.44 ng/mL (ref 0.10–4.00)

## 2021-05-01 MED ORDER — DEXLANSOPRAZOLE 60 MG PO CPDR: 60.0000 mg | DELAYED_RELEASE_CAPSULE | Freq: Every day | ORAL | 1 refills | Status: DC

## 2021-05-01 MED ORDER — TESTOSTERONE CYPIONATE 200 MG/ML IM SOLN: 100.0000 mg | INTRAMUSCULAR | 2 refills | Status: DC

## 2021-05-01 MED ORDER — AMOXICILLIN-POT CLAVULANATE 875-125 MG PO TABS: 1.0000 | ORAL_TABLET | Freq: Two times a day (BID) | ORAL | 0 refills | Status: AC

## 2021-05-01 MED ORDER — CYCLOBENZAPRINE HCL 10 MG PO TABS: 10.0000 mg | ORAL_TABLET | Freq: Every day | ORAL | 0 refills | Status: AC

## 2021-05-01 MED ORDER — FLUTICASONE PROPIONATE 50 MCG/ACT NA SUSP: 2.0000 | Freq: Every day | NASAL | 3 refills | Status: DC

## 2021-05-01 NOTE — Patient Instructions (Addendum)
Good to meet you today! Please go to the lab for blood work and I will send results through MyChart.  Please call urology to schedule f/up appt.  Glad you are having your colonoscopy done!   Please try to work on quitting smoking.  See you back in 3-6 months. Call sooner if concerns.

## 2021-05-01 NOTE — Progress Notes (Signed)
Subjective:    Patient ID: Alexander Garza, male    DOB: 03-20-1971, 50 y.o.   MRN: [id number]  Chief Complaint  Patient presents with   CPE    [facility] sent to [facility]    HPI Patient is in today for new patient and annual exam.  Acute concerns: Testosterone was managed by his previous PCP. Currently doing 100 mg twice weekly for the last year and a half. Feels better energy, no hot flashes, muscle improvement, thought clarity. Last dose 04/29/21 (has been doing one shot weekly the last 4 weeks to help stretch out dosing b/c he was going to run out).   Recently in the ED for urinary issues, twice in the last year. Started on Flomax recently. Still complains of urinary urgency. He plans to f/up with urologist.   Chronic back issues (self-employed, works in [employer]).   Previously took Zocor, which affected both of his shoulders. States he will not take any cholesterol medications again.   Health maintenance: Lifestyle/ exercise: Stretches, sit-ups, physically demanding job Nutrition: Doing well  Mental health: No concerns  Caffeine: Some coffee in the morning, small cup Sleep: Doing well, recently up at night to urinate though (see acute issues above) Substance use: Smokes 1 pack every 2 days; alcohol only about once or twice per month  Sexual activity: Monogamous with wife  Immunizations: Does not take immunizations  Colonoscopy: Scheduled in Dec 2022; prior colonoscopy in 2012 was negative   Past Medical History:  Diagnosis Date   Acid reflux disease    Decreased libido 04/23/2019   GERD (gastroesophageal reflux disease) 04/23/2019   HLD (hyperlipidemia) 04/23/2019   Hypercholesteremia    Vitamin D deficiency disease 04/23/2019    Past Surgical History:  Procedure Laterality Date   APPENDECTOMY     fatty tumor removal     chest, arm and leg    Family History  Problem Relation Age of Onset   COPD Mother    Ulcerative colitis Mother    Lung cancer Father      Social History   Tobacco Use   Smoking status: Former    Types: Cigarettes    Quit date: 02/21/2019    Years since quitting: 2.1   Smokeless tobacco: Never  Vaping Use   Vaping Use: Never used  Substance Use Topics   Alcohol use: Yes    Alcohol/week: 1.0 standard drink    Types: 1 Cans of beer per week   Drug use: No     No Known Allergies  Review of Systems NEGATIVE UNLESS OTHERWISE INDICATED IN HPI      Objective:     BP 127/72   Pulse 61   Temp 98.4 F (36.9 C)   Ht 5' 11.5" (1.816 m)   Wt 193 lb 6.4 oz (87.7 kg)   SpO2 98%   BMI 26.60 kg/m   Wt Readings from Last 3 Encounters:  05/01/21 193 lb 6.4 oz (87.7 kg)  08/21/20 207 lb 6.4 oz (94.1 kg)  08/06/20 205 lb 9.6 oz (93.3 kg)    BP Readings from Last 3 Encounters:  05/01/21 127/72  04/24/21 (!) 145/87  01/23/21 121/78     Physical Exam Vitals and nursing note reviewed.  Constitutional:      General: He is not in acute distress.    Appearance: Normal appearance. He is not toxic-appearing.  HENT:     Head: Normocephalic and atraumatic.     Right Ear: Tympanic membrane, ear canal and external ear  normal.     Left Ear: Tympanic membrane, ear canal and external ear normal.     Nose: Nose normal.     Mouth/Throat:     Mouth: Mucous membranes are moist.     Pharynx: Oropharynx is clear.  Eyes:     Extraocular Movements: Extraocular movements intact.     Conjunctiva/sclera: Conjunctivae normal.     Pupils: Pupils are equal, round, and reactive to light.  Cardiovascular:     Rate and Rhythm: Normal rate and regular rhythm.     Pulses: Normal pulses.     Heart sounds: Normal heart sounds.  Pulmonary:     Effort: Pulmonary effort is normal.     Breath sounds: Normal breath sounds.  Abdominal:     General: Abdomen is flat. Bowel sounds are normal.     Palpations: Abdomen is soft.     Tenderness: There is no abdominal tenderness.  Musculoskeletal:        General: Normal range of motion.      Cervical back: Normal range of motion and neck supple.  Skin:    General: Skin is warm and dry.  Neurological:     General: No focal deficit present.     Mental Status: He is alert and oriented to person, place, and time.  Psychiatric:        Mood and Affect: Mood normal.        Behavior: Behavior normal.       Assessment & Plan:   Problem List Items Addressed This Visit       Digestive   GERD (gastroesophageal reflux disease)   Relevant Medications   dexlansoprazole (DEXILANT) 60 MG capsule     Other   HLD (hyperlipidemia)   Other Visit Diagnoses     Encounter for annual physical exam    -  Primary   Relevant Orders   Testosterone (Completed)   CBC with Differential/Platelet (Completed)   Comprehensive metabolic panel (Completed)   Lipid panel (Completed)   PSA (Completed)   Acute prostatitis without hematuria       Relevant Orders   Testosterone (Completed)   PSA (Completed)   Low testosterone in male       Nicotine dependence, cigarettes, uncomplicated       Chronic bilateral low back pain without sciatica       Relevant Medications   cyclobenzaprine (FLEXERIL) 10 MG tablet        Meds ordered this encounter  Medications   amoxicillin-clavulanate (AUGMENTIN) 875-125 MG tablet    Sig: Take 1 tablet by mouth 2 (two) times daily for 10 days.    Dispense:  20 tablet    Refill:  0   cyclobenzaprine (FLEXERIL) 10 MG tablet    Sig: Take 1 tablet (10 mg total) by mouth at bedtime for 20 days.    Dispense:  20 tablet    Refill:  0   fluticasone (FLONASE) 50 MCG/ACT nasal spray    Sig: Place 2 sprays into both nostrils daily.    Dispense:  16 g    Refill:  3   dexlansoprazole (DEXILANT) 60 MG capsule    Sig: Take 1 capsule (60 mg total) by mouth daily.    Dispense:  90 capsule    Refill:  1   testosterone cypionate (DEPOTESTOSTERONE CYPIONATE) 200 MG/ML injection    Sig: Inject 0.5 mLs (100 mg total) into the muscle 2 (two) times a week.    Dispense:  10  mL  Refill:  2    1. Encounter for annual physical exam Age-appropriate screening and counseling performed today. Will check labs and call with results. Preventive measures discussed and printed in AVS for patient.   2. Acute prostatitis without hematuria -Rx for Augmentin -Schedule f/up with Urology  3. Low testosterone in male -T level today; plan to have urology follow and treat  4. Mixed hyperlipidemia -Lipid panel fasting today -Intolerance to Zocor; states he will not treat cholesterol with medication again  5. Nicotine dependence, cigarettes, uncomplicated -Encouraged him to consider quitting   6. Chronic bilateral low back pain without sciatica -Very rarely needs Flexeril 10 mg prn at night. Rx refilled.  7. Gastroesophageal reflux disease without esophagitis -Dexilant 60 mg daily; rx refilled. Stable, no issues.    Eros Montour M Shinita Mac, PA-C

## 2021-05-12 ENCOUNTER — Other Ambulatory Visit: Payer: Self-pay | Admitting: *Deleted

## 2021-05-12 DIAGNOSIS — E782 Mixed hyperlipidemia: Secondary | ICD-10-CM

## 2021-05-13 ENCOUNTER — Ambulatory Visit: Payer: 59 | Admitting: Physician Assistant

## 2021-06-02 ENCOUNTER — Telehealth: Payer: Self-pay

## 2021-06-02 NOTE — Telephone Encounter (Signed)
Wife called regarding Alexander Garza, said that Arlyss Repress was going to call something in for his cholesterol. They received a call from a nutritionist and he really rather try the meds his mother is on called Pravastatin.

## 2021-06-03 ENCOUNTER — Other Ambulatory Visit: Payer: Self-pay | Admitting: Physician Assistant

## 2021-06-03 MED ORDER — PRAVASTATIN SODIUM 20 MG PO TABS: 20.0000 mg | ORAL_TABLET | Freq: Every day | ORAL | 0 refills | Status: DC

## 2021-06-04 NOTE — Telephone Encounter (Signed)
Left voice message for patient to call clinic.  

## 2021-06-09 NOTE — Telephone Encounter (Signed)
Spouse has called in.  States script was sent to the incorrect pharmacy.  States script needs to go to [facility] on [address] in [place].

## 2021-06-10 ENCOUNTER — Telehealth: Payer: Self-pay | Admitting: Physician Assistant

## 2021-06-10 ENCOUNTER — Other Ambulatory Visit: Payer: Self-pay

## 2021-06-10 MED ORDER — PRAVASTATIN SODIUM 20 MG PO TABS: 20.0000 mg | ORAL_TABLET | Freq: Every day | ORAL | 0 refills | Status: DC

## 2021-06-10 NOTE — Telephone Encounter (Signed)
Rx sent in

## 2021-06-10 NOTE — Telephone Encounter (Signed)
Rx sent in patient notified 

## 2021-06-15 NOTE — Progress Notes (Signed)
H&P  Chief Complaint: Abdominal pain, lower urinary tract symptoms  History of Present Illness: 51 year old male comes in today for evaluation and management of about a years worth of intermittent lower urinary tract and abdominal symptoms.  The patient at times has frequency, urgency, pressure to urinate, dysuria, feeling of incomplete emptying.  Also with dysuria.  He has been treated for urinary tract infections although to his memory he has never had a positive urine culture or infected appearing urine.  He did get significantly better when treated with amoxicillin/clavulanate, following another antibiotic that did not work significantly well.  He denies any gross hematuria.  He has had no fever or chills.  He does have some left greater than right lower abdominal discomfort.  Perhaps some improvement of symptoms with defecation.  He has had no blood in his stool.  His mother has diverticulitis, but he has never been treated for this.  The patient had recent colonoscopy which revealed several polyps.  He does not know if he had any other significant findings.  Past Medical History:  Diagnosis Date   Acid reflux disease    Decreased libido 04/23/2019   GERD (gastroesophageal reflux disease) 04/23/2019   HLD (hyperlipidemia) 04/23/2019   Hypercholesteremia    Vitamin D deficiency disease 04/23/2019    Past Surgical History:  Procedure Laterality Date   APPENDECTOMY     fatty tumor removal     chest, arm and leg    Home Medications:  Allergies as of 06/16/2021       Reactions   Zocor [simvastatin] Other (See Comments)   Bilateral shoulder myalgias        Medication List        Accurate as of June 15, 2021  8:08 PM. If you have any questions, ask your nurse or doctor.          albuterol 108 (90 Base) MCG/ACT inhaler Commonly known as: VENTOLIN HFA Inhale 1-2 puffs into the lungs every 6 (six) hours as needed for wheezing or shortness of breath.   dexlansoprazole 60 MG  capsule Commonly known as: DEXILANT Take 1 capsule (60 mg total) by mouth daily.   fluticasone 50 MCG/ACT nasal spray Commonly known as: FLONASE Place 2 sprays into both nostrils daily.   pravastatin 20 MG tablet Commonly known as: PRAVACHOL Take 1 tablet (20 mg total) by mouth daily.   tamsulosin 0.4 MG Caps capsule Commonly known as: FLOMAX Take 1 capsule (0.4 mg total) by mouth daily.   testosterone cypionate 200 MG/ML injection Commonly known as: DEPOTESTOSTERONE CYPIONATE Inject 0.5 mLs (100 mg total) into the muscle 2 (two) times a week.   Vitamin D-3 125 MCG (5000 UT) Tabs Take 2 tablets by mouth daily.        Allergies:  Allergies  Allergen Reactions   Zocor [Simvastatin] Other (See Comments)    Bilateral shoulder myalgias    Family History  Problem Relation Age of Onset   COPD Mother    Ulcerative colitis Mother    Lung cancer Father     Social History:  reports that he quit smoking about 2 years ago. He has never used smokeless tobacco. He reports current alcohol use of about 1.0 standard drink per week. He reports that he does not use drugs.  ROS: A complete review of systems was performed.  All systems are negative except for pertinent findings as noted.  Physical Exam:  Vital signs in last 24 hours: There were no vitals taken for this  visit. Constitutional:  Alert and oriented, No acute distress Cardiovascular: Regular rate  Respiratory: Normal respiratory effort but with left greater than right lower abdominal tenderness.  No mass, rebound or guarding.  Nontender, nondistended, no abdominal masses. No CVAT.  Genitourinary: Normal male phallus, testes are descended bilaterally and non-tender and without masses, scrotum is normal in appearance without lesions or masses, perineum is normal on inspection. Lymphatic: No lymphadenopathy Neurologic: Grossly intact, no focal deficits Psychiatric: Normal mood and affect  I have reviewed prior pt  notes  I have reviewed notes from referring/previous physicians--I cannot get any information regarding his recent colonoscopy  I have reviewed urinalysis results  I have independently reviewed bladder scan--residual urine volume about 230 mL.    Impression/Assessment:  1.  Lower urinary tract symptoms.  May be due to BPH and he does have a moderate residual urine volume.  Urine is clear, however.  2.  Lower abdominal pain, tenderness in left lower quadrant.  Could be diverticulitis  Plan:  1.  I will start the patient on alfuzosin.  He had been on tamsulosin in the past but not currently.  2.  I will give him 10 days of Augmentin  3.  I will have him come back in 2 to 3 weeks to recheck.  I let him know that I may eventually want to proceed with CT abdomen and pelvis to rule out any extravesical/urinary abnormalities

## 2021-06-16 ENCOUNTER — Ambulatory Visit (INDEPENDENT_AMBULATORY_CARE_PROVIDER_SITE_OTHER): Payer: 59 | Admitting: Urology

## 2021-06-16 ENCOUNTER — Other Ambulatory Visit: Payer: Self-pay

## 2021-06-16 ENCOUNTER — Encounter: Payer: Self-pay | Admitting: Urology

## 2021-06-16 VITALS — BP 129/71 | HR 66

## 2021-06-16 DIAGNOSIS — R300 Dysuria: Secondary | ICD-10-CM | POA: Diagnosis not present

## 2021-06-16 DIAGNOSIS — R350 Frequency of micturition: Secondary | ICD-10-CM

## 2021-06-16 LAB — URINALYSIS, ROUTINE W REFLEX MICROSCOPIC
Bilirubin, UA: NEGATIVE
Glucose, UA: NEGATIVE
Ketones, UA: NEGATIVE
Leukocytes,UA: NEGATIVE
Nitrite, UA: NEGATIVE
Protein,UA: NEGATIVE
RBC, UA: NEGATIVE
Specific Gravity, UA: 1.015 (ref 1.005–1.030)
Urobilinogen, Ur: 0.2 mg/dL (ref 0.2–1.0)
pH, UA: 7 (ref 5.0–7.5)

## 2021-06-16 LAB — BLADDER SCAN AMB NON-IMAGING

## 2021-06-16 MED ORDER — AMOXICILLIN-POT CLAVULANATE 875-125 MG PO TABS
1.0000 | ORAL_TABLET | Freq: Two times a day (BID) | ORAL | 0 refills | Status: DC
Start: 2021-06-16 — End: 2022-02-26

## 2021-06-16 MED ORDER — ALFUZOSIN HCL ER 10 MG PO TB24: 10.0000 mg | ORAL_TABLET | Freq: Every day | ORAL | 11 refills | Status: DC

## 2021-06-16 NOTE — Progress Notes (Signed)
scanlpost void residual= 217ml Urological Symptom Review  Patient is experiencing the following symptoms: Frequent urination Hard to postpone urination Burning/pain with urination Get up at night to urinate Stream starts and stops Weak stream Penile pain (male only)    Review of Systems  Gastrointestinal (upper)  : Indigestion/heartburn  Gastrointestinal (lower) : Negative for lower GI symptoms  Constitutional : Negative for symptoms  Skin: Negative for skin symptoms  Eyes: Negative for eye symptoms  Ear/Nose/Throat : Sinus problems  Hematologic/Lymphatic: Negative for Hematologic/Lymphatic symptoms  Cardiovascular : Negative for cardiovascular symptoms  Respiratory : Negative for respiratory symptoms  Endocrine: Negative for endocrine symptoms  Musculoskeletal: Negative for musculoskeletal symptoms  Neurological: Negative for neurological symptoms  Psychologic: Negative for psychiatric symptoms

## 2021-06-19 ENCOUNTER — Other Ambulatory Visit: Payer: Self-pay | Admitting: Urology

## 2021-07-07 ENCOUNTER — Ambulatory Visit (INDEPENDENT_AMBULATORY_CARE_PROVIDER_SITE_OTHER): Payer: 59 | Admitting: Urology

## 2021-07-07 ENCOUNTER — Other Ambulatory Visit: Payer: Self-pay

## 2021-07-07 VITALS — BP 123/66 | HR 58 | Wt 195.0 lb

## 2021-07-07 DIAGNOSIS — R1032 Left lower quadrant pain: Secondary | ICD-10-CM

## 2021-07-07 DIAGNOSIS — R6882 Decreased libido: Secondary | ICD-10-CM | POA: Diagnosis not present

## 2021-07-07 DIAGNOSIS — R300 Dysuria: Secondary | ICD-10-CM

## 2021-07-07 DIAGNOSIS — R350 Frequency of micturition: Secondary | ICD-10-CM | POA: Diagnosis not present

## 2021-07-07 DIAGNOSIS — R1031 Right lower quadrant pain: Secondary | ICD-10-CM

## 2021-07-07 DIAGNOSIS — R1030 Lower abdominal pain, unspecified: Secondary | ICD-10-CM

## 2021-07-07 LAB — URINALYSIS, ROUTINE W REFLEX MICROSCOPIC
Bilirubin, UA: NEGATIVE
Glucose, UA: NEGATIVE
Ketones, UA: NEGATIVE
Leukocytes,UA: NEGATIVE
Nitrite, UA: NEGATIVE
Protein,UA: NEGATIVE
RBC, UA: NEGATIVE
Specific Gravity, UA: 1.02 (ref 1.005–1.030)
Urobilinogen, Ur: 0.2 mg/dL (ref 0.2–1.0)
pH, UA: 7 (ref 5.0–7.5)

## 2021-07-07 NOTE — Progress Notes (Signed)
History of Present Illness: Here for follow-up.  1.3.2023: 51 year old male comes in today for evaluation and management of about a years worth of intermittent lower urinary tract and abdominal symptoms.  The patient at times has frequency, urgency, pressure to urinate, dysuria, feeling of incomplete emptying.  Also with dysuria.  He has been treated for urinary tract infections although to his memory he has never had a positive urine culture or infected appearing urine.  He did get significantly better when treated with amoxicillin/clavulanate, following another antibiotic that did not work significantly well.  He denies any gross hematuria.  He has had no fever or chills.  He does have some left greater than right lower abdominal discomfort.  Perhaps some improvement of symptoms with defecation.  He has had no blood in his stool.  His mother has diverticulitis, but he has never been treated for this.  The patient had recent colonoscopy which revealed several polyps.  He does not know if he had any other significant findings.  He did have a residual urine volume of 230 mL.  He was placed on Augmentin for possible diverticulitis as well as alfuzosin.  1.24.2023: He thinks that the amoxicillin really did help his lower abdominal symptoms.  He completed all 10 days and for the most part, except for a little bit of occasional burning, feels much better.  He is on alfuzosin.  He is feeling that his urinary stream is better with that as well.  Residual urine volume today 86 mL Past Medical History:  Diagnosis Date   Acid reflux disease    Decreased libido 04/23/2019   GERD (gastroesophageal reflux disease) 04/23/2019   HLD (hyperlipidemia) 04/23/2019   Hypercholesteremia    Vitamin D deficiency disease 04/23/2019    Past Surgical History:  Procedure Laterality Date   APPENDECTOMY     fatty tumor removal     chest, arm and leg    Home Medications:  Allergies as of 07/07/2021       Reactions    Zocor [simvastatin] Other (See Comments)   Bilateral shoulder myalgias        Medication List        Accurate as of July 07, 2021 10:34 AM. If you have any questions, ask your nurse or doctor.          albuterol 108 (90 Base) MCG/ACT inhaler Commonly known as: VENTOLIN HFA Inhale 1-2 puffs into the lungs every 6 (six) hours as needed for wheezing or shortness of breath.   alfuzosin 10 MG 24 hr tablet Commonly known as: UROXATRAL Take 1 tablet (10 mg total) by mouth daily with breakfast.   amoxicillin-clavulanate 875-125 MG tablet Commonly known as: AUGMENTIN Take 1 tablet by mouth every 12 (twelve) hours.   dexlansoprazole 60 MG capsule Commonly known as: DEXILANT Take 1 capsule (60 mg total) by mouth daily.   fluticasone 50 MCG/ACT nasal spray Commonly known as: FLONASE Place 2 sprays into both nostrils daily.   pravastatin 20 MG tablet Commonly known as: PRAVACHOL Take 1 tablet (20 mg total) by mouth daily.   tamsulosin 0.4 MG Caps capsule Commonly known as: FLOMAX Take 1 capsule (0.4 mg total) by mouth daily.   testosterone cypionate 200 MG/ML injection Commonly known as: DEPOTESTOSTERONE CYPIONATE Inject 0.5 mLs (100 mg total) into the muscle 2 (two) times a week.   Vitamin D-3 125 MCG (5000 UT) Tabs Take 2 tablets by mouth daily.        Allergies:  Allergies  Allergen  Reactions   Zocor [Simvastatin] Other (See Comments)    Bilateral shoulder myalgias    Family History  Problem Relation Age of Onset   COPD Mother    Ulcerative colitis Mother    Lung cancer Father     Social History:  reports that he quit smoking about 2 years ago. His smoking use included cigarettes. He has never used smokeless tobacco. He reports current alcohol use of about 1.0 standard drink per week. He reports that he does not use drugs.  ROS: A complete review of systems was performed.  All systems are negative except for pertinent findings as noted.  Physical  Exam:  Vital signs in last 24 hours: BP 123/66    Pulse (!) 58    Wt 195 lb (88.5 kg)    BMI 26.82 kg/m  Constitutional:  Alert and oriented, No acute distress Cardiovascular: Regular rate  Respiratory: Normal respiratory effort Neurologic: Grossly intact, no focal deficits Psychiatric: Normal mood and affect  I have reviewed prior pt notes  I have reviewed urinalysis results  I have independently reviewed prior imaging    Impression/Assessment:  1.  Lower abdominal discomfort/dysuria-better with amoxicillin.  I think this may well be resolved diverticulitis  2.  History of low testosterone, on supraphysiologic doses of testosterone  3.  BPH, symptoms better, emptying better on alfuzosin  Plan:  1.  I will order contrasted CT abdomen and pelvis, call with results.  I think it is a good way to follow-up his symptoms  2.  Continue alfuzosin for now  3.  We will end up following him down the road for his testosterone administration as his PCP is no longer available

## 2021-07-07 NOTE — Progress Notes (Signed)
Urological Symptom Review  Patient is experiencing the following symptoms: Frequent urination Burning/pain with urination Get up at night to urinate   Review of Systems  Gastrointestinal (upper)  : Negative for upper GI symptoms  Gastrointestinal (lower) : Negative for lower GI symptoms  Constitutional : Negative for symptoms  Skin: Negative for skin symptoms  Eyes: Negative for eye symptoms  Ear/Nose/Throat : Negative for Ear/Nose/Throat symptoms  Hematologic/Lymphatic: Negative for Hematologic/Lymphatic symptoms  Cardiovascular : Negative for cardiovascular symptoms  Respiratory : Negative for respiratory symptoms  Endocrine: Negative for endocrine symptoms  Musculoskeletal: Negative for musculoskeletal symptoms  Neurological: Negative for neurological symptoms  Psychologic: Negative for psychiatric symptoms  

## 2021-08-04 ENCOUNTER — Other Ambulatory Visit: Payer: Self-pay

## 2021-08-04 ENCOUNTER — Ambulatory Visit (HOSPITAL_COMMUNITY)
Admission: RE | Admit: 2021-08-04 | Discharge: 2021-08-04 | Disposition: A | Discharge status: Home / Self Care | Payer: 59 | Source: Ambulatory Visit | Attending: Urology | Admitting: Urology

## 2021-08-04 DIAGNOSIS — N411 Chronic prostatitis: Secondary | ICD-10-CM | POA: Diagnosis not present

## 2021-08-04 DIAGNOSIS — R1030 Lower abdominal pain, unspecified: Secondary | ICD-10-CM | POA: Diagnosis present

## 2021-08-04 DIAGNOSIS — K5730 Diverticulosis of large intestine without perforation or abscess without bleeding: Secondary | ICD-10-CM | POA: Diagnosis not present

## 2021-08-04 DIAGNOSIS — R102 Pelvic and perineal pain: Secondary | ICD-10-CM | POA: Insufficient documentation

## 2021-08-04 MED ORDER — IOHEXOL 300 MG/ML  SOLN
100.0000 mL | Freq: Once | INTRAMUSCULAR | Status: AC | PRN
  Administered 2021-08-04: 100 mL via INTRAVENOUS

## 2021-08-12 ENCOUNTER — Telehealth: Payer: Self-pay | Admitting: Physician Assistant

## 2021-08-12 NOTE — Telephone Encounter (Signed)
..  Type of form received: [employer] Assistance Form ? ?Additional comments:  ? ?Received by: CM ? ?Form should be Faxed to: [phone] ? ?Form should be mailed to:   ? ?Is patient requesting call for pickup: ? ? ?Form placed:  In provider's box ? ?Attach charge sheet. Y ? ?Individual made aware of 3-5 business day turn around (Y/N)? Y ?  ?

## 2021-08-14 NOTE — Telephone Encounter (Signed)
Form complete sent to front office staff ?

## 2021-08-28 ENCOUNTER — Telehealth: Payer: Self-pay | Admitting: Physician Assistant

## 2021-08-28 NOTE — Telephone Encounter (Signed)
.. ?  Encourage patient to contact the pharmacy for refills or they can request refills through [facility] ? ?LAST APPOINTMENT DATE:  05/01/21 ? ?NEXT APPOINTMENT DATE: N/A ? ?MEDICATION:fluticasone (FLONASE) 50 MCG/ACT nasal spray ? ?Is the patient out of medication? yes ? ?PHARMACY: ?[facility] [phone] - [place], [place] [address] Phone:  [phone]  ?Fax:  [phone]  ?  ? ? ?Let patient know to contact pharmacy at the end of the day to make sure medication is ready. ? ?Please notify patient to allow 48-72 hours to process  ?

## 2021-08-31 ENCOUNTER — Other Ambulatory Visit: Payer: Self-pay

## 2021-08-31 MED ORDER — FLUTICASONE PROPIONATE 50 MCG/ACT NA SUSP: 2.0000 | Freq: Every day | NASAL | 3 refills | Status: DC

## 2021-08-31 NOTE — Telephone Encounter (Signed)
Rx sent in

## 2021-09-08 ENCOUNTER — Other Ambulatory Visit: Payer: Self-pay | Admitting: Physician Assistant

## 2021-12-21 ENCOUNTER — Other Ambulatory Visit: Payer: Self-pay | Admitting: Physician Assistant

## 2022-02-08 ENCOUNTER — Telehealth: Payer: Self-pay | Admitting: Physician Assistant

## 2022-02-08 ENCOUNTER — Other Ambulatory Visit: Payer: Self-pay

## 2022-02-08 MED ORDER — PRAVASTATIN SODIUM 20 MG PO TABS: 20.0000 mg | ORAL_TABLET | Freq: Every day | ORAL | 0 refills | Status: DC

## 2022-02-08 NOTE — Telephone Encounter (Signed)
Rx sent to pharmacy and pt needs appt for future refills

## 2022-02-08 NOTE — Telephone Encounter (Signed)
  LAST APPOINTMENT DATE:  Please schedule appointment if longer than 1 year 05/01/21  NEXT APPOINTMENT DATE: 02/26/22  MEDICATION: pravastatin (PRAVACHOL) 20 MG tablet  Is the patient out of medication? 3 days left  PHARMACY: [id number] - [place], [place] - [address] Phone:  [phone]  Fax:  [phone]      Let patient know to contact pharmacy at the end of the day to make sure medication is ready.  Please notify patient to allow 48-72 hours to process

## 2022-02-10 NOTE — Telephone Encounter (Addendum)
FYI  Caller states: - She is able to pick up patient's medication requested below at [facility] but future prescriptions need to be sent to [facility] at [address] AT [facility] [address]

## 2022-02-10 NOTE — Telephone Encounter (Signed)
Thank you for FYI, this is noted however patient may also want to update pharmacy in MyChart to ensure medication is sent to correct pharmacy. Pt has been advised of recommendations and verbalized understanding.

## 2022-02-26 ENCOUNTER — Encounter: Payer: Self-pay | Admitting: Physician Assistant

## 2022-02-26 ENCOUNTER — Ambulatory Visit (INDEPENDENT_AMBULATORY_CARE_PROVIDER_SITE_OTHER): Payer: 59 | Admitting: Physician Assistant

## 2022-02-26 VITALS — BP 127/73 | HR 56 | Temp 98.3°F | Ht 71.5 in | Wt 186.2 lb

## 2022-02-26 DIAGNOSIS — R102 Pelvic and perineal pain: Secondary | ICD-10-CM

## 2022-02-26 DIAGNOSIS — E782 Mixed hyperlipidemia: Secondary | ICD-10-CM | POA: Diagnosis not present

## 2022-02-26 DIAGNOSIS — M25511 Pain in right shoulder: Secondary | ICD-10-CM

## 2022-02-26 DIAGNOSIS — R3915 Urgency of urination: Secondary | ICD-10-CM

## 2022-02-26 DIAGNOSIS — F17210 Nicotine dependence, cigarettes, uncomplicated: Secondary | ICD-10-CM

## 2022-02-26 DIAGNOSIS — K5790 Diverticulosis of intestine, part unspecified, without perforation or abscess without bleeding: Secondary | ICD-10-CM

## 2022-02-26 DIAGNOSIS — G8929 Other chronic pain: Secondary | ICD-10-CM

## 2022-02-26 DIAGNOSIS — I700 Atherosclerosis of aorta: Secondary | ICD-10-CM

## 2022-02-26 LAB — CBC WITH DIFFERENTIAL/PLATELET
Basophils Absolute: 0 10*3/uL (ref 0.0–0.1)
Basophils Relative: 0.4 % (ref 0.0–3.0)
Eosinophils Absolute: 0.1 10*3/uL (ref 0.0–0.7)
Eosinophils Relative: 0.8 % (ref 0.0–5.0)
HCT: 52.1 % — ABNORMAL HIGH (ref 39.0–52.0)
Hemoglobin: 17.6 g/dL — ABNORMAL HIGH (ref 13.0–17.0)
Lymphocytes Relative: 17.2 % (ref 12.0–46.0)
Lymphs Abs: 1.5 10*3/uL (ref 0.7–4.0)
MCHC: 33.8 g/dL (ref 30.0–36.0)
MCV: 94.6 fl (ref 78.0–100.0)
Monocytes Absolute: 0.5 10*3/uL (ref 0.1–1.0)
Monocytes Relative: 6.1 % (ref 3.0–12.0)
Neutro Abs: 6.6 10*3/uL (ref 1.4–7.7)
Neutrophils Relative %: 75.5 % (ref 43.0–77.0)
Platelets: 189 10*3/uL (ref 150.0–400.0)
RBC: 5.5 Mil/uL (ref 4.22–5.81)
RDW: 13.3 % (ref 11.5–15.5)
WBC: 8.7 10*3/uL (ref 4.0–10.5)

## 2022-02-26 LAB — POCT URINALYSIS DIPSTICK
Bilirubin, UA: NEGATIVE
Blood, UA: NEGATIVE
Glucose, UA: NEGATIVE
Ketones, UA: NEGATIVE
Leukocytes, UA: NEGATIVE
Nitrite, UA: NEGATIVE
Protein, UA: NEGATIVE
Spec Grav, UA: 1.02 (ref 1.010–1.025)
Urobilinogen, UA: 0.2 E.U./dL
pH, UA: 6 (ref 5.0–8.0)

## 2022-02-26 LAB — COMPREHENSIVE METABOLIC PANEL
ALT: 30 U/L (ref 0–53)
AST: 20 U/L (ref 0–37)
Albumin: 4.3 g/dL (ref 3.5–5.2)
Alkaline Phosphatase: 84 U/L (ref 39–117)
BUN: 17 mg/dL (ref 6–23)
CO2: 30 mEq/L (ref 19–32)
Calcium: 9.7 mg/dL (ref 8.4–10.5)
Chloride: 102 mEq/L (ref 96–112)
Creatinine, Ser: 1.03 mg/dL (ref 0.40–1.50)
GFR: 84.23 mL/min (ref >60.00)
Glucose, Bld: 93 mg/dL (ref 70–99)
Potassium: 5 mEq/L (ref 3.5–5.1)
Sodium: 139 mEq/L (ref 135–145)
Total Bilirubin: 0.7 mg/dL (ref 0.2–1.2)
Total Protein: 7.2 g/dL (ref 6.0–8.3)

## 2022-02-26 LAB — LIPID PANEL
Cholesterol: 211 mg/dL — ABNORMAL HIGH (ref 0–200)
HDL: 45.4 mg/dL (ref >39.00)
LDL Cholesterol: 133 mg/dL — ABNORMAL HIGH (ref 0–99)
NonHDL: 165.86
Total CHOL/HDL Ratio: 5
Triglycerides: 164 mg/dL — ABNORMAL HIGH (ref 0.0–149.0)
VLDL: 32.8 mg/dL (ref 0.0–40.0)

## 2022-02-26 LAB — PSA: PSA: 0.46 ng/mL (ref 0.10–4.00)

## 2022-02-26 MED ORDER — PREDNISONE 20 MG PO TABS: 20.0000 mg | ORAL_TABLET | Freq: Two times a day (BID) | ORAL | 1 refills | Status: DC

## 2022-02-26 MED ORDER — AMOXICILLIN-POT CLAVULANATE 875-125 MG PO TABS: 1.0000 | ORAL_TABLET | Freq: Two times a day (BID) | ORAL | 0 refills | Status: AC

## 2022-02-26 NOTE — Patient Instructions (Addendum)
Good to see you today. Please check labs - treat pending any abnormal.  Start on Augmentin as directed.  Prednisone for shoulder pain. Consider sports medicine referral.  Let me know how you're doing!

## 2022-02-26 NOTE — Progress Notes (Unsigned)
Subjective:    Patient ID: Alexander Garza, male    DOB: 1970-11-26, 51 y.o.   MRN: [id number]  Chief Complaint  Patient presents with   Follow-up    Possible UTI, burns when urinating, pressure and has to urinate every 2 hrs.    HPI Patient is in today for f/up from last year.   Also notes -  Urinary frequency started Monday, now having burning and pressure.  He has had a few UTIs in lifetime.  Urology appt this last year - CT was normal.  No longer taking Testosterone since urologist - mentioned it wasn't good for him. Feels more fatigued.  Colonoscopy Dec 2022 - due for repeat in 3 years   Pravastatin 20 mg daily - seems to be tolerating well so far.   Former smoker, down to 1 pack per week now.   Occasionally has flareup of bursitis in his right shoulder after overhead work, he says he has done well with prednisone in the past and requesting a refill on this today.  Past Medical History:  Diagnosis Date   Acid reflux disease    Decreased libido 04/23/2019   GERD (gastroesophageal reflux disease) 04/23/2019   HLD (hyperlipidemia) 04/23/2019   Hypercholesteremia    Vitamin D deficiency disease 04/23/2019    Past Surgical History:  Procedure Laterality Date   APPENDECTOMY     fatty tumor removal     chest, arm and leg    Family History  Problem Relation Age of Onset   COPD Mother    Ulcerative colitis Mother    Lung cancer Father     Social History   Tobacco Use   Smoking status: Former    Types: Cigarettes    Quit date: 02/21/2019    Years since quitting: 3.0   Smokeless tobacco: Never  Vaping Use   Vaping Use: Never used  Substance Use Topics   Alcohol use: Yes    Alcohol/week: 1.0 standard drink of alcohol    Types: 1 Cans of beer per week   Drug use: No     Allergies  Allergen Reactions   Zocor [Simvastatin] Other (See Comments)    Bilateral shoulder myalgias    Review of Systems NEGATIVE UNLESS OTHERWISE INDICATED IN HPI      Objective:      BP 127/73 (BP Location: Left Arm, Patient Position: Sitting)   Pulse (!) 56   Temp 98.3 F (36.8 C) (Temporal)   Ht 5' 11.5" (1.816 m)   Wt 186 lb 3.2 oz (84.5 kg)   SpO2 91%   BMI 25.61 kg/m   Wt Readings from Last 3 Encounters:  02/26/22 186 lb 3.2 oz (84.5 kg)  07/07/21 195 lb (88.5 kg)  05/01/21 193 lb 6.4 oz (87.7 kg)    BP Readings from Last 3 Encounters:  02/26/22 127/73  07/07/21 123/66  06/16/21 129/71     Physical Exam Vitals and nursing note reviewed.  Constitutional:      General: He is not in acute distress.    Appearance: Normal appearance. He is not toxic-appearing.  HENT:     Head: Normocephalic and atraumatic.  Eyes:     Extraocular Movements: Extraocular movements intact.     Conjunctiva/sclera: Conjunctivae normal.     Pupils: Pupils are equal, round, and reactive to light.  Cardiovascular:     Rate and Rhythm: Normal rate and regular rhythm.     Pulses: Normal pulses.     Heart sounds: Normal heart sounds.  Pulmonary:     Effort: Pulmonary effort is normal.     Breath sounds: Normal breath sounds.  Abdominal:     General: Abdomen is flat. Bowel sounds are normal.     Palpations: Abdomen is soft.     Tenderness: There is abdominal tenderness in the suprapubic area and left lower quadrant. There is no right CVA tenderness or left CVA tenderness.  Musculoskeletal:        General: Normal range of motion.     Right shoulder: Tenderness present. No swelling, deformity, effusion, bony tenderness or crepitus. Normal range of motion. Normal strength. Normal pulse.     Cervical back: Normal range of motion and neck supple.  Skin:    General: Skin is warm and dry.  Neurological:     General: No focal deficit present.     Mental Status: He is alert and oriented to person, place, and time.  Psychiatric:        Mood and Affect: Mood normal.        Behavior: Behavior normal.        Assessment & Plan:  Urinary urgency -     POCT urinalysis  dipstick -     Urine Culture -     CBC with Differential/Platelet -     Comprehensive metabolic panel -     PSA  Suprapubic pain -     CBC with Differential/Platelet -     Comprehensive metabolic panel -     PSA  Diverticulosis Assessment & Plan: As noted on last colonoscopy.  Orders: -     CBC with Differential/Platelet -     Comprehensive metabolic panel -     PSA  Mixed hyperlipidemia Assessment & Plan: Plan to update lipid panel.  Currently on pravastatin 20 mg daily.  Orders: -     Lipid panel  Aortic atherosclerosis (HCC) Assessment & Plan: Encourage patient to continue to work on lifestyle changes.  He needs to quit smoking.  Update lipid panel today.  He is going to continue on pravastatin 20 mg daily.  Orders: -     Lipid panel  Nicotine dependence, cigarettes, uncomplicated Assessment & Plan: Strongly encourage patient to quit completely.   Chronic right shoulder pain Assessment & Plan: Intermittent flareups of bursitis right shoulder.  Requesting prednisone today.  Sent in refill for him to take as directed.  Sports medicine if worse or no improvement.   Other orders -     Amoxicillin-Pot Clavulanate; Take 1 tablet by mouth 2 (two) times daily for 7 days.  Dispense: 14 tablet; Refill: 0 -     predniSONE; Take 1 tablet (20 mg total) by mouth 2 (two) times daily with a meal for 6 days.  Dispense: 12 tablet; Refill: 1    Reassured patient that his point-of-care urinalysis was normal.  Plan for culture for thoroughness.  Possibly having acute prostatitis or diverticulitis flareup.  Plan to start him on Augmentin twice daily as directed.  Bland diet, lots of fluids.  Recheck if worse or no improvement.    Return in about 6 months (around 08/27/2022) for recheck .  This note was prepared with assistance of Conservation officer, historic buildings. Occasional wrong-word or sound-a-like substitutions may have occurred due to the inherent limitations of voice  recognition software.     Saburo Luger M Zahniya Zellars, PA-C

## 2022-02-27 LAB — URINE CULTURE
MICRO NUMBER:: 13923947
Result:: NO GROWTH
SPECIMEN QUALITY:: ADEQUATE

## 2022-02-28 NOTE — Assessment & Plan Note (Signed)
Intermittent flareups of bursitis right shoulder.  Requesting prednisone today.  Sent in refill for him to take as directed.  Sports medicine if worse or no improvement.

## 2022-02-28 NOTE — Assessment & Plan Note (Signed)
Plan to update lipid panel.  Currently on pravastatin 20 mg daily.

## 2022-02-28 NOTE — Assessment & Plan Note (Signed)
Encourage patient to continue to work on lifestyle changes.  He needs to quit smoking.  Update lipid panel today.  He is going to continue on pravastatin 20 mg daily.

## 2022-02-28 NOTE — Assessment & Plan Note (Signed)
As noted on last colonoscopy.

## 2022-02-28 NOTE — Assessment & Plan Note (Signed)
Strongly encourage patient to quit completely.

## 2022-03-03 ENCOUNTER — Other Ambulatory Visit: Payer: Self-pay

## 2022-03-03 DIAGNOSIS — D582 Other hemoglobinopathies: Secondary | ICD-10-CM

## 2022-03-15 ENCOUNTER — Other Ambulatory Visit: Payer: Self-pay

## 2022-03-15 DIAGNOSIS — R350 Frequency of micturition: Secondary | ICD-10-CM

## 2022-03-15 MED ORDER — ALFUZOSIN HCL ER 10 MG PO TB24: 10.0000 mg | ORAL_TABLET | Freq: Every day | ORAL | 11 refills | Status: DC

## 2022-03-16 ENCOUNTER — Other Ambulatory Visit: Payer: Self-pay | Admitting: *Deleted

## 2022-03-16 DIAGNOSIS — D582 Other hemoglobinopathies: Secondary | ICD-10-CM

## 2022-03-23 NOTE — Progress Notes (Unsigned)
New Hematology/Oncology Consult   Requesting MD: Theresa Duty, [place]      Reason for Consult: Elevated hemoglobin  HPI: [name] is a 51 year old man referred for evaluation of an elevated hemoglobin.  He was seen by Theresa Duty, PA on 02/26/2022 for follow-up from the previous year.  CBC returned with a hemoglobin of 17.6, hematocrit 52.1, otherwise normal.  Comparison CBC from 05/01/2021 showed hemoglobin 17.2, hematocrit 50.8; 08/21/2020 hemoglobin 17.9, hematocrit 52.4; 04/23/2019 hemoglobin 18.5, hematocrit 53.5.     Past Medical History:  Diagnosis Date   Acid reflux disease    Decreased libido 04/23/2019   GERD (gastroesophageal reflux disease) 04/23/2019   HLD (hyperlipidemia) 04/23/2019   Hypercholesteremia    Vitamin D deficiency disease 04/23/2019     Past Surgical History:  Procedure Laterality Date   APPENDECTOMY     fatty tumor removal     chest, arm and leg     Current Outpatient Medications:    albuterol (VENTOLIN HFA) 108 (90 Base) MCG/ACT inhaler, Inhale 1-2 puffs into the lungs every 6 (six) hours as needed for wheezing or shortness of breath., Disp: 18 g, Rfl: 0   alfuzosin (UROXATRAL) 10 MG 24 hr tablet, Take 1 tablet (10 mg total) by mouth daily with breakfast., Disp: 30 tablet, Rfl: 11   Cholecalciferol (VITAMIN D-3) 125 MCG (5000 UT) TABS, Take 2 tablets by mouth daily., Disp: , Rfl:    dexlansoprazole (DEXILANT) 60 MG capsule, Take 1 capsule (60 mg total) by mouth daily., Disp: 90 capsule, Rfl: 1   fluticasone (FLONASE) 50 MCG/ACT nasal spray, Place 2 sprays into both nostrils daily., Disp: 16 g, Rfl: 3   Multiple Vitamins-Minerals (MULTI ADULT GUMMIES) CHEW, , Disp: , Rfl:    pravastatin (PRAVACHOL) 20 MG tablet, Take 1 tablet (20 mg total) by mouth daily., Disp: 90 tablet, Rfl: 0:     Allergies  Allergen Reactions   Zocor [Simvastatin] Other (See Comments)    Bilateral shoulder myalgias    FH:  SOCIAL HISTORY:  Review of  Systems:  Positives include:  A complete ROS was otherwise negative.   Physical Exam:  There were no vitals taken for this visit.  HEENT: *** Lungs: *** Cardiac: *** Abdomen: *** GU: ***  Vascular: *** Lymph nodes: *** Neurologic: *** Skin: *** Musculoskeletal: ***  LABS:  No results for input(s): "WBC", "HGB", "HCT", "PLT" in the last 72 hours.  No results for input(s): "NA", "K", "CL", "CO2", "GLUCOSE", "BUN", "CREATININE", "CALCIUM" in the last 72 hours.    RADIOLOGY:  No results found.  Assessment and Plan:   ***    Ned Card, NP 03/23/2022, 2:45 PM

## 2022-03-24 ENCOUNTER — Inpatient Hospital Stay: Payer: 59 | Admitting: Nurse Practitioner

## 2022-03-24 ENCOUNTER — Inpatient Hospital Stay: Payer: 59 | Attending: Nurse Practitioner

## 2022-03-24 ENCOUNTER — Encounter: Payer: Self-pay | Admitting: Nurse Practitioner

## 2022-03-24 VITALS — BP 117/67 | HR 58 | Temp 98.1°F | Resp 18 | Ht 71.0 in | Wt 187.6 lb

## 2022-03-24 DIAGNOSIS — R718 Other abnormality of red blood cells: Secondary | ICD-10-CM | POA: Insufficient documentation

## 2022-03-24 DIAGNOSIS — Z87891 Personal history of nicotine dependence: Secondary | ICD-10-CM | POA: Insufficient documentation

## 2022-03-24 DIAGNOSIS — D751 Secondary polycythemia: Secondary | ICD-10-CM

## 2022-03-24 DIAGNOSIS — D582 Other hemoglobinopathies: Secondary | ICD-10-CM

## 2022-03-24 LAB — CBC WITH DIFFERENTIAL (CANCER CENTER ONLY)
Abs Immature Granulocytes: 0.02 10*3/uL (ref 0.00–0.07)
Basophils Absolute: 0 10*3/uL (ref 0.0–0.1)
Basophils Relative: 1 %
Eosinophils Absolute: 0.1 10*3/uL (ref 0.0–0.5)
Eosinophils Relative: 1 %
HCT: 45.3 % (ref 39.0–52.0)
Hemoglobin: 16 g/dL (ref 13.0–17.0)
Immature Granulocytes: 0 %
Lymphocytes Relative: 23 %
Lymphs Abs: 2 10*3/uL (ref 0.7–4.0)
MCH: 32.3 pg (ref 26.0–34.0)
MCHC: 35.3 g/dL (ref 30.0–36.0)
MCV: 91.3 fL (ref 80.0–100.0)
Monocytes Absolute: 0.5 10*3/uL (ref 0.1–1.0)
Monocytes Relative: 6 %
Neutro Abs: 6.1 10*3/uL (ref 1.7–7.7)
Neutrophils Relative %: 69 %
Platelet Count: 188 10*3/uL (ref 150–400)
RBC: 4.96 MIL/uL (ref 4.22–5.81)
RDW: 13.2 % (ref 11.5–15.5)
WBC Count: 8.8 10*3/uL (ref 4.0–10.5)
nRBC: 0 % (ref 0.0–0.2)

## 2022-03-24 LAB — SAVE SMEAR(SSMR), FOR PROVIDER SLIDE REVIEW

## 2022-04-26 ENCOUNTER — Other Ambulatory Visit: Payer: Self-pay | Admitting: Physician Assistant

## 2022-07-08 ENCOUNTER — Other Ambulatory Visit: Payer: Self-pay

## 2022-07-08 ENCOUNTER — Telehealth: Payer: Self-pay | Admitting: Physician Assistant

## 2022-07-08 MED ORDER — PRAVASTATIN SODIUM 20 MG PO TABS: 20.0000 mg | ORAL_TABLET | Freq: Every day | ORAL | 0 refills | Status: DC

## 2022-07-08 NOTE — Telephone Encounter (Signed)
LAST APPOINTMENT DATE:  02/26/22  NEXT APPOINTMENT DATE: 08/27/22  MEDICATION: pravastatin (PRAVACHOL) 20 MG tablet   Is the patient out of medication? Has 2 pills left  PHARMACY:  [id number] - [place], [place] - [address] [place] AT [facility] [place], [place] [address] Phone: [phone]  Fax: [phone]

## 2022-07-08 NOTE — Telephone Encounter (Signed)
Rx sent to pharmacy   

## 2022-08-05 ENCOUNTER — Other Ambulatory Visit: Payer: Self-pay | Admitting: Physician Assistant

## 2022-08-27 ENCOUNTER — Ambulatory Visit: Payer: 59 | Admitting: Physician Assistant

## 2022-09-16 ENCOUNTER — Encounter: Payer: Self-pay | Admitting: Physician Assistant

## 2022-09-16 ENCOUNTER — Ambulatory Visit: Payer: 59 | Admitting: Physician Assistant

## 2022-09-16 VITALS — BP 120/63 | HR 60 | Temp 97.8°F | Ht 71.0 in | Wt 200.2 lb

## 2022-09-16 DIAGNOSIS — Z0000 Encounter for general adult medical examination without abnormal findings: Secondary | ICD-10-CM

## 2022-09-16 DIAGNOSIS — Z87891 Personal history of nicotine dependence: Secondary | ICD-10-CM | POA: Diagnosis not present

## 2022-09-16 DIAGNOSIS — E782 Mixed hyperlipidemia: Secondary | ICD-10-CM | POA: Diagnosis not present

## 2022-09-16 LAB — CBC WITH DIFFERENTIAL/PLATELET
Basophils Absolute: 0 10*3/uL (ref 0.0–0.1)
Basophils Relative: 0.5 % (ref 0.0–3.0)
Eosinophils Absolute: 0.1 10*3/uL (ref 0.0–0.7)
Eosinophils Relative: 1.1 % (ref 0.0–5.0)
HCT: 46.3 % (ref 39.0–52.0)
Hemoglobin: 15.9 g/dL (ref 13.0–17.0)
Lymphocytes Relative: 24.4 % (ref 12.0–46.0)
Lymphs Abs: 1.6 10*3/uL (ref 0.7–4.0)
MCHC: 34.2 g/dL (ref 30.0–36.0)
MCV: 94.4 fl (ref 78.0–100.0)
Monocytes Absolute: 0.4 10*3/uL (ref 0.1–1.0)
Monocytes Relative: 6.5 % (ref 3.0–12.0)
Neutro Abs: 4.5 10*3/uL (ref 1.4–7.7)
Neutrophils Relative %: 67.5 % (ref 43.0–77.0)
Platelets: 211 10*3/uL (ref 150.0–400.0)
RBC: 4.91 Mil/uL (ref 4.22–5.81)
RDW: 13.7 % (ref 11.5–15.5)
WBC: 6.6 10*3/uL (ref 4.0–10.5)

## 2022-09-16 LAB — COMPREHENSIVE METABOLIC PANEL
ALT: 25 U/L (ref 0–53)
AST: 18 U/L (ref 0–37)
Albumin: 4.5 g/dL (ref 3.5–5.2)
Alkaline Phosphatase: 88 U/L (ref 39–117)
BUN: 20 mg/dL (ref 6–23)
CO2: 30 mEq/L (ref 19–32)
Calcium: 9.5 mg/dL (ref 8.4–10.5)
Chloride: 103 mEq/L (ref 96–112)
Creatinine, Ser: 0.94 mg/dL (ref 0.40–1.50)
GFR: 93.63 mL/min (ref >60.00)
Glucose, Bld: 101 mg/dL — ABNORMAL HIGH (ref 70–99)
Potassium: 4.4 mEq/L (ref 3.5–5.1)
Sodium: 139 mEq/L (ref 135–145)
Total Bilirubin: 0.6 mg/dL (ref 0.2–1.2)
Total Protein: 6.8 g/dL (ref 6.0–8.3)

## 2022-09-16 LAB — LIPID PANEL
Cholesterol: 240 mg/dL — ABNORMAL HIGH (ref 0–200)
HDL: 53.6 mg/dL (ref >39.00)
LDL Cholesterol: 168 mg/dL — ABNORMAL HIGH (ref 0–99)
NonHDL: 186.73
Total CHOL/HDL Ratio: 4
Triglycerides: 95 mg/dL (ref 0.0–149.0)
VLDL: 19 mg/dL (ref 0.0–40.0)

## 2022-09-16 MED ORDER — CYCLOBENZAPRINE HCL 10 MG PO TABS: 10.0000 mg | ORAL_TABLET | Freq: Three times a day (TID) | ORAL | 0 refills | Status: DC | PRN

## 2022-09-16 NOTE — Patient Instructions (Addendum)
Great to see you today!  Keep up good work.  Labs today  Should receive a call about lung cancer screening program  Let me know how you're doing with cholesterol medication - let me know if any major muscle aches / pains  Excellent job quitting smoking!

## 2022-09-16 NOTE — Progress Notes (Signed)
Subjective:    Patient ID: Alexander Garza, male    DOB: June 30, 1970, 52 y.o.   MRN: [id number]  Chief Complaint  Patient presents with   Follow-up    Pt in the office for follow up; pt is fasting for labs; and states is here for his CPE; pt is wanting to discuss rotator cuff going bad in right shoulder, discuss Cortisone injections, possible referral to sports medicine; also past discussion on free scan due to family hx of cancer and smoking history, pt quit smoking 4 months ago; pt requesting renewal of Rx for Flexeril current Rx is expired     HPI Patient is in today for CPE.  Acute concerns: R shoulder - intermittent rotator cuff issues - will call Dr. 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 Nutrition: Working on drinking more water regularly  Mental health: No concerns  Sleep: Usually does well, sometimes shoulder bothers him  Substance use: Quit smoking 4 months ago (smoking since age 57, about 10-19 per day) Immunizations: UTD Colonoscopy: UTD  Skin: No concerns   Past Medical History:  Diagnosis Date   Acid reflux disease    Decreased libido 04/23/2019   GERD (gastroesophageal reflux disease) 04/23/2019   HLD (hyperlipidemia) 04/23/2019   Hypercholesteremia    Vitamin D deficiency disease 04/23/2019    Past Surgical History:  Procedure Laterality Date   APPENDECTOMY     fatty tumor removal     chest, arm and leg    Family History  Problem Relation Age of Onset   COPD Mother    Ulcerative colitis Mother    Lung cancer Father     Social History   Tobacco Use   Smoking status: Former    Types: Cigarettes    Quit date: 02/21/2019    Years since quitting: 3.5   Smokeless tobacco: Never  Vaping Use   Vaping Use: Never used  Substance Use Topics   Alcohol use: Yes    Alcohol/week: 1.0 standard drink of alcohol    Types: 1 Cans of beer per week   Drug use: No     Allergies   Allergen Reactions   Zocor [Simvastatin] Other (See Comments)    Bilateral shoulder myalgias    Review of Systems NEGATIVE UNLESS OTHERWISE INDICATED IN HPI      Objective:     BP 120/63 (BP Location: Left Arm)   Pulse 60   Temp 97.8 F (36.6 C) (Temporal)   Ht 5\' 11"  (1.803 m)   Wt 200 lb 3.2 oz (90.8 kg)   SpO2 98%   BMI 27.92 kg/m   Wt Readings from Last 3 Encounters:  09/16/22 200 lb 3.2 oz (90.8 kg)  03/24/22 187 lb 9.6 oz (85.1 kg)  02/26/22 186 lb 3.2 oz (84.5 kg)    BP Readings from Last 3 Encounters:  09/16/22 120/63  03/24/22 117/67  02/26/22 127/73     Physical Exam Vitals and nursing note reviewed.  Constitutional:      General: He is not in acute distress.    Appearance: Normal appearance. He is not toxic-appearing.  HENT:     Head: Normocephalic and atraumatic.     Right Ear: Tympanic membrane, ear canal and external ear normal.     Left Ear: Tympanic membrane, ear canal and external ear normal.     Nose: Nose normal.     Mouth/Throat:     Mouth: Mucous membranes  are moist.     Pharynx: Oropharynx is clear.  Eyes:     Extraocular Movements: Extraocular movements intact.     Conjunctiva/sclera: Conjunctivae normal.     Pupils: Pupils are equal, round, and reactive to light.  Cardiovascular:     Rate and Rhythm: Normal rate and regular rhythm.     Pulses: Normal pulses.     Heart sounds: Normal heart sounds.  Pulmonary:     Effort: Pulmonary effort is normal.     Breath sounds: Normal breath sounds.  Abdominal:     General: Abdomen is flat. Bowel sounds are normal.     Palpations: Abdomen is soft.     Tenderness: There is no abdominal tenderness.  Musculoskeletal:        General: Normal range of motion.     Cervical back: Normal range of motion and neck supple.  Skin:    General: Skin is warm and dry.     Findings: No lesion or rash.  Neurological:     General: No focal deficit present.     Mental Status: He is alert and oriented to  person, place, and time.  Psychiatric:        Mood and Affect: Mood normal.        Behavior: Behavior normal.        Assessment & Plan:  Encounter for annual physical exam -     CBC with Differential/Platelet -     Comprehensive metabolic panel -     Lipid panel  Former moderate cigarette smoker (10-19 per day) -     Ambulatory Referral for Lung Cancer Scre -     CBC with Differential/Platelet -     Comprehensive metabolic panel  Mixed hyperlipidemia -     Lipid panel  Other orders -     Cyclobenzaprine HCl; Take 1 tablet (10 mg total) by mouth 3 (three) times daily as needed for muscle spasms.  Dispense: 30 tablet; Refill: 0   Age-appropriate screening and counseling performed today. Will check labs and call with results. Preventive measures discussed and printed in AVS for patient.   Congratulated him on smoking cessation!    Patient Counseling: [x]   Nutrition: Stressed importance of moderation in sodium/caffeine intake, saturated fat and cholesterol, caloric balance, sufficient intake of fresh fruits, vegetables, and fiber.  [x]   Stressed the importance of regular exercise.   [x]   Substance Abuse: Discussed cessation/primary prevention of tobacco, alcohol, or other drug use; driving or other dangerous activities under the influence; availability of treatment for abuse.   []   Injury prevention: Discussed safety belts, safety helmets, smoke detector, smoking near bedding or upholstery.   []   Sexuality: Discussed sexually transmitted diseases, partner selection, use of condoms, avoidance of unintended pregnancy  and contraceptive alternatives.   [x]   Dental health: Discussed importance of regular tooth brushing, flossing, and dental visits.  [x]   Health maintenance and immunizations reviewed. Please refer to Health maintenance section.       Return in about 1 year (around 09/16/2023) for CPE, fasting labs .     Olivene Cookston M Javiana Anwar, PA-C

## 2022-09-24 ENCOUNTER — Telehealth: Payer: Self-pay

## 2022-09-24 NOTE — Telephone Encounter (Signed)
Patient wife called to schedule OV with Dr. Retta Diones to discuss labs and medications for testosterone.

## 2022-09-27 ENCOUNTER — Other Ambulatory Visit: Payer: Self-pay | Admitting: Urology

## 2022-09-27 DIAGNOSIS — R7989 Other specified abnormal findings of blood chemistry: Secondary | ICD-10-CM

## 2022-09-27 NOTE — Telephone Encounter (Signed)
My chart message sent to patient of lab visit and orders to be completed.

## 2022-09-29 ENCOUNTER — Other Ambulatory Visit: Payer: Self-pay | Admitting: Physician Assistant

## 2022-10-14 ENCOUNTER — Other Ambulatory Visit: Payer: 59

## 2022-10-14 DIAGNOSIS — R7989 Other specified abnormal findings of blood chemistry: Secondary | ICD-10-CM | POA: Diagnosis not present

## 2022-10-20 LAB — TESTOSTERONE,FREE AND TOTAL
Testosterone, Free: 8.6 pg/mL (ref 7.2–24.0)
Testosterone: 532 ng/dL (ref 264–916)

## 2022-10-20 LAB — PSA: Prostate Specific Ag, Serum: 0.4 ng/mL (ref 0.0–4.0)

## 2022-10-25 NOTE — Progress Notes (Signed)
History of Present Illness: 52 yo mle presents for f/u of BPH w/ LUTS, h/o low testosterone as well as h/o possible diverticulitis.  Past Medical History:  Diagnosis Date   Acid reflux disease    Decreased libido 04/23/2019   GERD (gastroesophageal reflux disease) 04/23/2019   HLD (hyperlipidemia) 04/23/2019   Hypercholesteremia    Vitamin D deficiency disease 04/23/2019    Past Surgical History:  Procedure Laterality Date   APPENDECTOMY     fatty tumor removal     chest, arm and leg    Home Medications:  Allergies as of 10/26/2022       Reactions   Zocor [simvastatin] Other (See Comments)   Bilateral shoulder myalgias        Medication List        Accurate as of Oct 25, 2022  8:26 AM. If you have any questions, ask your nurse or doctor.          albuterol 108 (90 Base) MCG/ACT inhaler Commonly known as: VENTOLIN HFA Inhale 1-2 puffs into the lungs every 6 (six) hours as needed for wheezing or shortness of breath.   alfuzosin 10 MG 24 hr tablet Commonly known as: UROXATRAL Take 1 tablet (10 mg total) by mouth daily with breakfast.   cyclobenzaprine 10 MG tablet Commonly known as: FLEXERIL Take 1 tablet (10 mg total) by mouth 3 (three) times daily as needed for muscle spasms.   Dexilant 60 MG capsule Generic drug: dexlansoprazole TAKE ONE CAPSULE BY MOUTH DAILY   fluticasone 50 MCG/ACT nasal spray Commonly known as: FLONASE Place 2 sprays into both nostrils daily.   Multi Adult Gummies Chew   pravastatin 20 MG tablet Commonly known as: PRAVACHOL TAKE 1 TABLET BY MOUTH EVERY DAY   Vitamin D-3 125 MCG (5000 UT) Tabs Take 2 tablets by mouth daily.        Allergies:  Allergies  Allergen Reactions   Zocor [Simvastatin] Other (See Comments)    Bilateral shoulder myalgias    Family History  Problem Relation Age of Onset   COPD Mother    Ulcerative colitis Mother    Lung cancer Father     Social History:  reports that he quit smoking about 3  years ago. His smoking use included cigarettes. He has never used smokeless tobacco. He reports current alcohol use of about 1.0 standard drink of alcohol per week. He reports that he does not use drugs.  ROS: A complete review of systems was performed.  All systems are negative except for pertinent findings as noted.  Physical Exam:  Vital signs in last 24 hours: There were no vitals taken for this visit. Constitutional:  Alert and oriented, No acute distress Cardiovascular: Regular rate  Respiratory: Normal respiratory effort GI: Abdomen is soft, nontender, nondistended, no abdominal masses. No CVAT.  Genitourinary: Normal male phallus, testes are descended bilaterally and non-tender and without masses, scrotum is normal in appearance without lesions or masses, perineum is normal on inspection. Lymphatic: No lymphadenopathy Neurologic: Grossly intact, no focal deficits Psychiatric: Normal mood and affect  I have reviewed prior pt notes  I have reviewed urinalysis results  I have independently reviewed prior imaging--CT A/P  I have reviewed prior PSA and testosterone results  I have reviewed prior urine culture   Impression/Assessment:  ***  Plan:  ***

## 2022-10-26 ENCOUNTER — Ambulatory Visit: Payer: 59 | Admitting: Urology

## 2022-10-26 ENCOUNTER — Encounter: Payer: Self-pay | Admitting: Urology

## 2022-10-26 VITALS — BP 118/72 | HR 52

## 2022-10-26 DIAGNOSIS — N401 Enlarged prostate with lower urinary tract symptoms: Secondary | ICD-10-CM | POA: Diagnosis not present

## 2022-10-26 DIAGNOSIS — E291 Testicular hypofunction: Secondary | ICD-10-CM

## 2022-10-26 DIAGNOSIS — R1030 Lower abdominal pain, unspecified: Secondary | ICD-10-CM | POA: Diagnosis not present

## 2022-10-26 DIAGNOSIS — R7989 Other specified abnormal findings of blood chemistry: Secondary | ICD-10-CM

## 2022-10-26 DIAGNOSIS — R350 Frequency of micturition: Secondary | ICD-10-CM

## 2022-10-26 MED ORDER — "SYRINGE/NEEDLE (DISP) 22G X 1-1/2"" 3 ML MISC": 12 refills | Status: DC

## 2022-10-26 MED ORDER — TESTOSTERONE CYPIONATE 200 MG/ML IM SOLN: 100.0000 mg | INTRAMUSCULAR | 1 refills | Status: DC

## 2022-10-26 MED ORDER — "NEEDLE (DISP) 18G X 1-1/2"" MISC": 12 refills | Status: DC

## 2022-10-27 ENCOUNTER — Encounter: Payer: Self-pay | Admitting: Physician Assistant

## 2022-10-27 ENCOUNTER — Other Ambulatory Visit: Payer: Self-pay | Admitting: Physician Assistant

## 2022-10-28 ENCOUNTER — Other Ambulatory Visit: Payer: Self-pay

## 2022-10-28 MED ORDER — FLUTICASONE PROPIONATE 50 MCG/ACT NA SUSP: 2.0000 | Freq: Every day | NASAL | 3 refills | Status: DC

## 2022-10-28 MED ORDER — ALBUTEROL SULFATE HFA 108 (90 BASE) MCG/ACT IN AERS: 1.0000 | INHALATION_SPRAY | Freq: Four times a day (QID) | RESPIRATORY_TRACT | 0 refills | Status: DC | PRN

## 2022-10-28 MED ORDER — PREDNISONE 20 MG PO TABS: 20.0000 mg | ORAL_TABLET | Freq: Two times a day (BID) | ORAL | 0 refills | Status: DC

## 2022-10-28 NOTE — Telephone Encounter (Signed)
Rx sent to pharmacy requested by patient [facility] [address] [facility]

## 2022-12-27 ENCOUNTER — Other Ambulatory Visit: Payer: 59

## 2022-12-27 DIAGNOSIS — R7989 Other specified abnormal findings of blood chemistry: Secondary | ICD-10-CM

## 2022-12-28 LAB — HEMOGLOBIN: Hemoglobin: 18 g/dL — ABNORMAL HIGH (ref 13.0–17.7)

## 2022-12-31 ENCOUNTER — Encounter: Payer: Self-pay | Admitting: *Deleted

## 2023-01-06 ENCOUNTER — Encounter: Payer: Self-pay | Admitting: Urology

## 2023-01-18 ENCOUNTER — Other Ambulatory Visit: Payer: Self-pay | Admitting: Physician Assistant

## 2023-02-03 ENCOUNTER — Ambulatory Visit (INDEPENDENT_AMBULATORY_CARE_PROVIDER_SITE_OTHER): Payer: 59

## 2023-02-03 ENCOUNTER — Ambulatory Visit (INDEPENDENT_AMBULATORY_CARE_PROVIDER_SITE_OTHER): Payer: 59 | Admitting: Student

## 2023-02-03 ENCOUNTER — Encounter (HOSPITAL_BASED_OUTPATIENT_CLINIC_OR_DEPARTMENT_OTHER): Payer: Self-pay | Admitting: Student

## 2023-02-03 DIAGNOSIS — M25561 Pain in right knee: Secondary | ICD-10-CM

## 2023-02-03 DIAGNOSIS — R609 Edema, unspecified: Secondary | ICD-10-CM | POA: Diagnosis not present

## 2023-02-03 DIAGNOSIS — M25461 Effusion, right knee: Secondary | ICD-10-CM

## 2023-02-03 MED ORDER — TRIAMCINOLONE ACETONIDE 40 MG/ML IJ SUSP
2.0000 mL | INTRAMUSCULAR | Status: AC | PRN
Start: 2023-02-03 — End: 2023-02-03
  Administered 2023-02-03: 2 mL via INTRA_ARTICULAR

## 2023-02-03 MED ORDER — LIDOCAINE HCL 1 % IJ SOLN
4.0000 mL | INTRAMUSCULAR | Status: AC | PRN
Start: 2023-02-03 — End: 2023-02-03
  Administered 2023-02-03: 4 mL

## 2023-02-03 NOTE — Progress Notes (Signed)
Chief Complaint: Right knee pain and swelling     History of Present Illness:    Alexander Garza is a 52 y.o. male presenting today with pain of the right knee.  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  He has been performing a lot of work on his knees and states that his right knee began hurting and swelling about a week ago.  He feels like has lost strength in the knee and has had difficulty bending it.  He has tried taking Aleve, a muscle relaxer, as well as using ice and Biofreeze.  Has also been wearing a knee sleeve.  Denies any previous knee injuries or surgeries.   Surgical History:   None  PMH/PSH/Family History/Social History/Meds/Allergies:    Past Medical History:  Diagnosis Date   Acid reflux disease    Decreased libido 04/23/2019   GERD (gastroesophageal reflux disease) 04/23/2019   HLD (hyperlipidemia) 04/23/2019   Hypercholesteremia    Vitamin D deficiency disease 04/23/2019   Past Surgical History:  Procedure Laterality Date   APPENDECTOMY     fatty tumor removal     chest, arm and leg   Social History   Socioeconomic History   Marital status: Married    Spouse name: Not on file   Number of children: Not on file   Years of education: Not on file   Highest education level: Not on file  Occupational History   Not on file  Tobacco Use   Smoking status: Former    Current packs/day: 0.00    Types: Cigarettes    Quit date: 02/21/2019    Years since quitting: 3.9   Smokeless tobacco: Never  Vaping Use   Vaping status: Never Used  Substance and Sexual Activity   Alcohol use: Yes    Alcohol/week: 1.0 standard drink of alcohol    Types: 1 Cans of beer per week   Drug use: No   Sexual activity: Yes  Other Topics Concern   Not on file  Social History Narrative   Married for 15 years,second.Lives with wife.Owns business remodelling homes.   Social Determinants of Health   Financial Resource  Strain: Not on file  Food Insecurity: Not on file  Transportation Needs: Not on file  Physical Activity: Not on file  Stress: Not on file  Social Connections: Not on file   Family History  Problem Relation Age of Onset   COPD Mother    Ulcerative colitis Mother    Lung cancer Father    Allergies  Allergen Reactions   Zocor [Simvastatin] Other (See Comments)    Bilateral shoulder myalgias   Current Outpatient Medications  Medication Sig Dispense Refill   albuterol (VENTOLIN HFA) 108 (90 Base) MCG/ACT inhaler Inhale 1-2 puffs into the lungs every 6 (six) hours as needed for wheezing or shortness of breath. 18 g 0   alfuzosin (UROXATRAL) 10 MG 24 hr tablet Take 1 tablet (10 mg total) by mouth daily with breakfast. 30 tablet 11   Cholecalciferol (VITAMIN D-3) 125 MCG (5000 UT) TABS Take 2 tablets by mouth daily.     cyclobenzaprine (FLEXERIL) 10 MG tablet Take 1 tablet (10 mg total) by mouth 3 (three) times daily as needed for muscle spasms. 30 tablet 0   DEXILANT 60 MG  capsule TAKE ONE CAPSULE BY MOUTH DAILY 90 capsule 2   fluticasone (FLONASE) 50 MCG/ACT nasal spray Place 2 sprays into both nostrils daily. 16 g 3   Multiple Vitamins-Minerals (MULTI ADULT GUMMIES) CHEW      NEEDLE, DISP, 18 G 18G X 1-1/2" MISC Use to draw up testosterone for injections 2 each 12   SYRINGE-NEEDLE, DISP, 3 ML 22G X 1-1/2" 3 ML MISC Use to inject testosterone 2 each 12   testosterone cypionate (DEPOTESTOSTERONE CYPIONATE) 200 MG/ML injection Inject 0.5 mLs (100 mg total) into the muscle every 7 (seven) days. 10 mL 1   No current facility-administered medications for this visit.   No results found.  Review of Systems:   A ROS was performed including pertinent positives and negatives as documented in the HPI.  Physical Exam :   Constitutional: NAD and appears stated age Neurological: Alert and oriented Psych: Appropriate affect and cooperative There were no vitals taken for this visit.    Comprehensive Musculoskeletal Exam:    Right knee appears swollen with moderate sized effusion palpable.  Small amount of fluid palpable over the prepatellar bursa.  Active range of motion from 0 to 90 degrees limited by pain and stiffness.  No medial or lateral joint line tenderness.  No laxity with varus or valgus stress.  Negative Lachman.  Imaging:   Xray (right knee 4 views): Slight medial joint space narrowing.  Suprapatellar effusion present.    I personally reviewed and interpreted the radiographs.   Assessment:   52 y.o. male with acute right knee pain.  X-rays do not show any significant osteoarthritis.  He does have a moderate-sized effusion which I believe is the likely contributing to some of his pain and stiffness.  There is also a likely component of prepatellar bursitis based on his exam and symptoms, however this is mild.  Today I have recommended joint aspiration of the right knee followed by cortisone injection as I believe this will provide him the most relief.  Patient is agreeable to this and I was able to aspirate 35 mL from the right knee without any complication followed by cortisone injection.  Recommend continuing to ice and can use knee sleeve for compression.  Will plan to see him back on an as-needed basis.  Plan :    -Right knee aspiration and cortisone injection performed today -Return to clinic as needed    Procedure Note  Patient: Alexander Garza             Date of Birth: 1971-03-29           MRN: [id number]             Visit Date: 02/03/2023  Procedures: Visit Diagnoses:  1. Acute pain of right knee   2. Effusion, right knee     Large Joint Inj: R knee on 02/03/2023 6:32 PM Indications: pain and joint swelling Details: 22 G 1.5 in needle, superolateral approach Medications: 4 mL lidocaine 1 %; 2 mL triamcinolone acetonide 40 MG/ML Aspirate: 35 mL serous and blood-tinged Outcome: tolerated well, no immediate complications Consent was given by  the patient. Immediately prior to procedure a time out was called to verify the correct patient, procedure, equipment, support staff and site/side marked as required. Patient was prepped and draped in the usual sterile fashion.      I personally saw and evaluated the patient, and participated in the management and treatment plan.  Hazle Nordmann, PA-C Orthopedics  This document was dictated using  [employer]. A reasonable attempt at proof reading has been made to minimize errors.

## 2023-02-04 ENCOUNTER — Telehealth: Payer: Self-pay | Admitting: Orthopedic Surgery

## 2023-02-04 NOTE — Telephone Encounter (Addendum)
Alexander Garza / Alexander Garza

## 2023-02-08 ENCOUNTER — Ambulatory Visit: Payer: 59 | Admitting: Orthopedic Surgery

## 2023-02-21 ENCOUNTER — Other Ambulatory Visit: Payer: Self-pay | Admitting: Physician Assistant

## 2023-02-22 ENCOUNTER — Other Ambulatory Visit: Payer: Self-pay | Admitting: Urology

## 2023-02-22 DIAGNOSIS — R350 Frequency of micturition: Secondary | ICD-10-CM

## 2023-02-22 MED ORDER — ALFUZOSIN HCL ER 10 MG PO TB24
10.0000 mg | ORAL_TABLET | Freq: Every day | ORAL | 11 refills | Status: DC
Start: 2023-02-22 — End: 2023-05-17

## 2023-02-23 ENCOUNTER — Telehealth: Payer: Self-pay | Admitting: Physician Assistant

## 2023-02-23 NOTE — Telephone Encounter (Signed)
Completed forms and faxed per patient request, pt has been advised forms completed and faxed

## 2023-02-23 NOTE — Telephone Encounter (Signed)
Patient dropped off document patient assistance form, , to be filled out by provider. Patient requested to send it back via Fax [phone] within 5-days. Document is located in providers tray at front office.Please advise at Mobile [phone] (mobile)

## 2023-04-28 ENCOUNTER — Other Ambulatory Visit: Payer: 59

## 2023-04-28 DIAGNOSIS — R7989 Other specified abnormal findings of blood chemistry: Secondary | ICD-10-CM

## 2023-04-29 LAB — PSA: Prostate Specific Ag, Serum: 0.4 ng/mL (ref 0.0–4.0)

## 2023-04-29 LAB — HEMOGLOBIN AND HEMATOCRIT, BLOOD
Hematocrit: 48.4 % (ref 37.5–51.0)
Hemoglobin: 16 g/dL (ref 13.0–17.7)

## 2023-04-29 LAB — TESTOSTERONE: Testosterone: 347 ng/dL (ref 264–916)

## 2023-05-02 NOTE — Progress Notes (Signed)
History of Present Illness: 52 yo male presents for f/u of BPH w/ LUTS, as well as h/o low testosterone.  He is dependent on alfuzosin to help him void better.    He does have low T and is on repletion w/ 100 mg IM weekly. He did take a break from injections in the past d/t erythrocytosis.  Recent labs--  Hgb/Hct--16/48.4 Total T--347 (drawn on Thursday) PSA--0.4  Since his last visit, he has stopped his testosterone administration. IPSS 16/4.  He is on alfuzosin.  He ask whether it would be okay to double his dose.    Past Medical History:  Diagnosis Date   Acid reflux disease    Decreased libido 04/23/2019   GERD (gastroesophageal reflux disease) 04/23/2019   HLD (hyperlipidemia) 04/23/2019   Hypercholesteremia    Vitamin D deficiency disease 04/23/2019    Past Surgical History:  Procedure Laterality Date   APPENDECTOMY     fatty tumor removal     chest, arm and leg    Home Medications:  Allergies as of 05/03/2023       Reactions   Zocor [simvastatin] Other (See Comments)   Bilateral shoulder myalgias        Medication List        Accurate as of November 18, [birth year], 2024  9:22 AM. If you have any questions, ask your nurse or doctor.          albuterol 108 (90 Base) MCG/ACT inhaler Commonly known as: VENTOLIN HFA Inhale 1-2 puffs into the lungs every 6 (six) hours as needed for wheezing or shortness of breath.   alfuzosin 10 MG 24 hr tablet Commonly known as: UROXATRAL Take 1 tablet (10 mg total) by mouth daily with breakfast.   cyclobenzaprine 10 MG tablet Commonly known as: FLEXERIL Take 1 tablet (10 mg total) by mouth 3 (three) times daily as needed for muscle spasms.   Dexilant 60 MG capsule Generic drug: dexlansoprazole TAKE ONE CAPSULE BY MOUTH DAILY   fluticasone 50 MCG/ACT nasal spray Commonly known as: FLONASE SPRAY 2 SPRAYS INTO EACH NOSTRIL EVERY DAY   Multi Adult Gummies Chew   NEEDLE (DISP) 18 G 18G X 1-1/2" Misc Use to draw up  testosterone for injections   SYRINGE-NEEDLE (DISP) 3 ML 22G X 1-1/2" 3 ML Misc Use to inject testosterone   testosterone cypionate 200 MG/ML injection Commonly known as: DEPOTESTOSTERONE CYPIONATE Inject 0.5 mLs (100 mg total) into the muscle every 7 (seven) days.   Vitamin D-3 125 MCG (5000 UT) Tabs Take 2 tablets by mouth daily.        Allergies:  Allergies  Allergen Reactions   Zocor [Simvastatin] Other (See Comments)    Bilateral shoulder myalgias    Family History  Problem Relation Age of Onset   COPD Mother    Ulcerative colitis Mother    Lung cancer Father     Social History:  reports that he quit smoking about 4 years ago. His smoking use included cigarettes. He has never used smokeless tobacco. He reports current alcohol use of about 1.0 standard drink of alcohol per week. He reports that he does not use drugs.  ROS: A complete review of systems was performed.  All systems are negative except for pertinent findings as noted.  Physical Exam:  Vital signs in last 24 hours: There were no vitals taken for this visit. Constitutional:  Alert and oriented, No acute distress Cardiovascular: Regular rate  Respiratory: Normal respiratory effort Neurologic: Grossly intact, no focal deficits  Psychiatric: Normal mood and affect  I have reviewed prior pt notes  I have reviewed urinalysis, PSA, free and total testosterone results results as well as hemoglobin/hematocrit  I have independently reviewed prior imaging--CT A/P  IPSS form reviewed.   Impression/Assessment:  1.  Low testosterone, history of.  He is off repletion still with normal testosterone levels  2.  Screening for prostate cancer, normal PSA-stable at 0.4  3.  BPH with symptoms-on alfuzosin.  Getting a bit worse. Plan:  1.  Heart healthy diet/lifestyle discussed with him at length  2.  Okay to stay off of his testosterone repletion  3.  Tried doubling up the dose of alfuzosin.  If this improves  his urinary symptoms he will let me know and I will change his prescription  4.  I will see back in 1 year for recheck

## 2023-05-03 ENCOUNTER — Ambulatory Visit: Payer: 59 | Admitting: Urology

## 2023-05-03 ENCOUNTER — Encounter: Payer: Self-pay | Admitting: Urology

## 2023-05-03 VITALS — BP 113/79 | HR 80 | Ht 71.0 in | Wt 200.0 lb

## 2023-05-03 DIAGNOSIS — R350 Frequency of micturition: Secondary | ICD-10-CM | POA: Diagnosis not present

## 2023-05-03 DIAGNOSIS — Z87898 Personal history of other specified conditions: Secondary | ICD-10-CM | POA: Diagnosis not present

## 2023-05-03 DIAGNOSIS — R7989 Other specified abnormal findings of blood chemistry: Secondary | ICD-10-CM | POA: Diagnosis not present

## 2023-05-03 DIAGNOSIS — N401 Enlarged prostate with lower urinary tract symptoms: Secondary | ICD-10-CM | POA: Diagnosis not present

## 2023-05-03 LAB — URINALYSIS, ROUTINE W REFLEX MICROSCOPIC
Bilirubin, UA: NEGATIVE
Glucose, UA: NEGATIVE
Ketones, UA: NEGATIVE
Leukocytes,UA: NEGATIVE
Nitrite, UA: NEGATIVE
Protein,UA: NEGATIVE
RBC, UA: NEGATIVE
Specific Gravity, UA: 1.02 (ref 1.005–1.030)
Urobilinogen, Ur: 0.2 mg/dL (ref 0.2–1.0)
pH, UA: 7 (ref 5.0–7.5)

## 2023-05-16 ENCOUNTER — Telehealth: Payer: Self-pay

## 2023-05-16 ENCOUNTER — Other Ambulatory Visit: Payer: Self-pay | Admitting: Physician Assistant

## 2023-05-16 NOTE — Telephone Encounter (Signed)
Patient's wife called and wanted to let you know that taking the alfuzosin bid is working really well.

## 2023-05-17 ENCOUNTER — Other Ambulatory Visit: Payer: Self-pay | Admitting: Urology

## 2023-05-17 ENCOUNTER — Encounter: Payer: Self-pay | Admitting: Physician Assistant

## 2023-05-17 DIAGNOSIS — R350 Frequency of micturition: Secondary | ICD-10-CM

## 2023-05-17 MED ORDER — ALFUZOSIN HCL ER 10 MG PO TB24: 10.0000 mg | ORAL_TABLET | Freq: Two times a day (BID) | ORAL | 3 refills | Status: DC

## 2023-05-18 ENCOUNTER — Other Ambulatory Visit: Payer: Self-pay

## 2023-05-18 DIAGNOSIS — R350 Frequency of micturition: Secondary | ICD-10-CM

## 2023-05-18 MED ORDER — ALFUZOSIN HCL ER 10 MG PO TB24: 10.0000 mg | ORAL_TABLET | Freq: Two times a day (BID) | ORAL | 3 refills | Status: DC

## 2023-05-18 NOTE — Telephone Encounter (Signed)
Denied refill request for steroid please advise if ok to send to pharmacy

## 2023-05-18 NOTE — Progress Notes (Signed)
Alfuszosin sent to the wrong pharmacy. Verified correct pharmacy with Pt and Rx updated and resent.

## 2023-05-20 NOTE — Telephone Encounter (Signed)
Please see patient response and advise patient directly regarding refill.

## 2023-08-23 ENCOUNTER — Encounter: Payer: Self-pay | Admitting: *Deleted

## 2023-08-26 ENCOUNTER — Ambulatory Visit (INDEPENDENT_AMBULATORY_CARE_PROVIDER_SITE_OTHER): Admitting: Family Medicine

## 2023-08-26 ENCOUNTER — Encounter: Payer: Self-pay | Admitting: Family Medicine

## 2023-08-26 VITALS — BP 125/83 | HR 59 | Temp 97.5°F | Ht 71.0 in | Wt 204.2 lb

## 2023-08-26 DIAGNOSIS — J4520 Mild intermittent asthma, uncomplicated: Secondary | ICD-10-CM | POA: Diagnosis not present

## 2023-08-26 DIAGNOSIS — G8929 Other chronic pain: Secondary | ICD-10-CM | POA: Diagnosis not present

## 2023-08-26 DIAGNOSIS — M25511 Pain in right shoulder: Secondary | ICD-10-CM | POA: Diagnosis not present

## 2023-08-26 MED ORDER — ALBUTEROL SULFATE HFA 108 (90 BASE) MCG/ACT IN AERS: 1.0000 | INHALATION_SPRAY | Freq: Four times a day (QID) | RESPIRATORY_TRACT | 0 refills | Status: DC | PRN

## 2023-08-26 MED ORDER — PREDNISONE 20 MG PO TABS: 40.0000 mg | ORAL_TABLET | Freq: Every day | ORAL | 0 refills | Status: AC

## 2023-08-26 NOTE — Patient Instructions (Addendum)
 It was very nice to see you today!  I will refill your prednisone and albuterol.  Return if symptoms worsen or fail to improve.   Take care, Dr Jimmey Ralph  PLEASE NOTE:  If you had any lab tests, please let us know if you have not heard back within a few days. You may see your results on mychart before we have a chance to review them but we will give you a call once they are reviewed by [place].   If we ordered any referrals today, please let us know if you have not heard from their office within the next week.   If you had any urgent prescriptions sent in today, please check with the pharmacy within an hour of our visit to make sure the prescription was transmitted appropriately.   Please try these tips to maintain a healthy lifestyle:  Eat at least 3 REAL meals and 1-2 snacks per day.  Aim for no more than 5 hours between eating.  If you eat breakfast, please do so within one hour of getting up.   Each meal should contain half fruits/vegetables, one quarter protein, and one quarter carbs (no bigger than a computer mouse)  Cut down on sweet beverages. This includes juice, soda, and sweet tea.   Drink at least 1 glass of water with each meal and aim for at least 8 glasses per day  Exercise at least 150 minutes every week.

## 2023-08-26 NOTE — Progress Notes (Signed)
   Alexander Garza is a 53 y.o. male who presents today for an office visit.  Assessment/Plan:  Chronic right shoulder pain He has had a flareup the last couple of weeks.  Based on history likely does have rotator cuff tear.  He has seen orthopedics for this in the past as well.  We did discuss referral to see sports medicine for further evaluation and management including potential steroid injection.  Patient stated he would think about this.  Will refill 6-day course of prednisone.  This is worked well for him the last few times he has had a flareup of his chronic right shoulder pain.  He will let us know if symptoms do not improve or if he would like to be referred to see sports medicine before he follows up with his PCP.  We discussed reasons to return to care.  Reactive airway disease Overall stable.  No recent flares.  Will refill his albuterol as his previous prescription is out of date.    Subjective:  HPI:  See Assessment / plan for status of chronic conditions. His main concern today is right shoulder pain.  This has been going on for several years.  Is previously seen orthopedics for this as well.  He was told that he may have a rotator cuff tear.  His orthopedist had managed with prednisone intermittently.  His PCP is also filled this for him a few times.  Is usually only had to take this once or twice per year.  He is wishing to avoid surgery if possible.  Most recently had a flareup a few weeks ago.  Believe this was due to overuse at work.  He is having more difficulty with reaching over his head.  He would like a refill on prednisone today.  He usually tolerates well without any significant side effects.       Objective:  Physical Exam: BP 125/83   Pulse (!) 59   Temp (!) 97.5 F (36.4 C) (Temporal)   Ht 5\' 11"  (1.803 m)   Wt 204 lb 3.2 oz (92.6 kg)   SpO2 98%   BMI 28.48 kg/m   Gen: No acute distress, resting comfortably CV: Regular rate and rhythm with no murmurs  appreciated Pulm: Normal work of breathing, clear to auscultation bilaterally with no crackles, wheezes, or rhonchi Neuro: Grossly normal, moves all extremities Psych: Normal affect and thought content      Nealie Mchatton M. Jimmey Ralph, MD 08/26/2023 7:32 AM

## 2023-09-15 DIAGNOSIS — Z1211 Encounter for screening for malignant neoplasm of colon: Secondary | ICD-10-CM | POA: Insufficient documentation

## 2023-09-15 DIAGNOSIS — Z83719 Family history of colon polyps, unspecified: Secondary | ICD-10-CM | POA: Insufficient documentation

## 2023-09-16 ENCOUNTER — Ambulatory Visit (INDEPENDENT_AMBULATORY_CARE_PROVIDER_SITE_OTHER): Payer: 59 | Admitting: Physician Assistant

## 2023-09-16 ENCOUNTER — Encounter: Payer: Self-pay | Admitting: Physician Assistant

## 2023-09-16 VITALS — BP 126/80 | HR 80 | Temp 98.0°F | Ht 71.0 in | Wt 200.8 lb

## 2023-09-16 DIAGNOSIS — B353 Tinea pedis: Secondary | ICD-10-CM | POA: Diagnosis not present

## 2023-09-16 DIAGNOSIS — G8929 Other chronic pain: Secondary | ICD-10-CM | POA: Diagnosis not present

## 2023-09-16 DIAGNOSIS — M25511 Pain in right shoulder: Secondary | ICD-10-CM

## 2023-09-16 DIAGNOSIS — Z0001 Encounter for general adult medical examination with abnormal findings: Secondary | ICD-10-CM

## 2023-09-16 DIAGNOSIS — E782 Mixed hyperlipidemia: Secondary | ICD-10-CM

## 2023-09-16 DIAGNOSIS — Z131 Encounter for screening for diabetes mellitus: Secondary | ICD-10-CM

## 2023-09-16 DIAGNOSIS — Z0000 Encounter for general adult medical examination without abnormal findings: Secondary | ICD-10-CM

## 2023-09-16 LAB — CBC WITH DIFFERENTIAL/PLATELET
Basophils Absolute: 0 10*3/uL (ref 0.0–0.1)
Basophils Relative: 0.4 % (ref 0.0–3.0)
Eosinophils Absolute: 0.1 10*3/uL (ref 0.0–0.7)
Eosinophils Relative: 1.5 % (ref 0.0–5.0)
HCT: 46.5 % (ref 39.0–52.0)
Hemoglobin: 16 g/dL (ref 13.0–17.0)
Lymphocytes Relative: 14.3 % (ref 12.0–46.0)
Lymphs Abs: 1.4 10*3/uL (ref 0.7–4.0)
MCHC: 34.5 g/dL (ref 30.0–36.0)
MCV: 94.3 fl (ref 78.0–100.0)
Monocytes Absolute: 0.4 10*3/uL (ref 0.1–1.0)
Monocytes Relative: 4.5 % (ref 3.0–12.0)
Neutro Abs: 7.6 10*3/uL (ref 1.4–7.7)
Neutrophils Relative %: 79.3 % — ABNORMAL HIGH (ref 43.0–77.0)
Platelets: 196 10*3/uL (ref 150.0–400.0)
RBC: 4.93 Mil/uL (ref 4.22–5.81)
RDW: 13.9 % (ref 11.5–15.5)
WBC: 9.6 10*3/uL (ref 4.0–10.5)

## 2023-09-16 LAB — LIPID PANEL
Cholesterol: 289 mg/dL — ABNORMAL HIGH (ref 0–200)
HDL: 51.7 mg/dL (ref >39.00)
LDL Cholesterol: 216 mg/dL — ABNORMAL HIGH (ref 0–99)
NonHDL: 237.02
Total CHOL/HDL Ratio: 6
Triglycerides: 103 mg/dL (ref 0.0–149.0)
VLDL: 20.6 mg/dL (ref 0.0–40.0)

## 2023-09-16 LAB — COMPREHENSIVE METABOLIC PANEL WITH GFR
ALT: 28 U/L (ref 0–53)
AST: 19 U/L (ref 0–37)
Albumin: 4.6 g/dL (ref 3.5–5.2)
Alkaline Phosphatase: 91 U/L (ref 39–117)
BUN: 20 mg/dL (ref 6–23)
CO2: 26 meq/L (ref 19–32)
Calcium: 9.4 mg/dL (ref 8.4–10.5)
Chloride: 103 meq/L (ref 96–112)
Creatinine, Ser: 0.9 mg/dL (ref 0.40–1.50)
GFR: 97.95 mL/min (ref >60.00)
Glucose, Bld: 97 mg/dL (ref 70–99)
Potassium: 4.1 meq/L (ref 3.5–5.1)
Sodium: 139 meq/L (ref 135–145)
Total Bilirubin: 0.7 mg/dL (ref 0.2–1.2)
Total Protein: 7.2 g/dL (ref 6.0–8.3)

## 2023-09-16 LAB — TSH: TSH: 1.25 u[IU]/mL (ref 0.35–5.50)

## 2023-09-16 LAB — HEMOGLOBIN A1C: Hgb A1c MFr Bld: 5.9 % (ref 4.6–6.5)

## 2023-09-16 MED ORDER — KETOCONAZOLE 2 % EX CREA
1.0000 | TOPICAL_CREAM | Freq: Every day | CUTANEOUS | 0 refills | Status: AC
Start: 2023-09-16 — End: ?

## 2023-09-16 MED ORDER — MELOXICAM 15 MG PO TABS: 15.0000 mg | ORAL_TABLET | Freq: Every day | ORAL | 0 refills | Status: DC

## 2023-09-16 MED ORDER — TRAMADOL HCL 50 MG PO TABS: 50.0000 mg | ORAL_TABLET | Freq: Three times a day (TID) | ORAL | 0 refills | Status: AC | PRN
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
## 2023-09-18 ENCOUNTER — Encounter: Payer: Self-pay | Admitting: Physician Assistant

## 2023-09-19 ENCOUNTER — Telehealth: Payer: Self-pay

## 2023-09-19 ENCOUNTER — Other Ambulatory Visit (HOSPITAL_COMMUNITY): Payer: Self-pay

## 2023-09-19 ENCOUNTER — Other Ambulatory Visit: Payer: Self-pay

## 2023-09-19 ENCOUNTER — Other Ambulatory Visit: Payer: Self-pay | Admitting: Physician Assistant

## 2023-09-19 DIAGNOSIS — I700 Atherosclerosis of aorta: Secondary | ICD-10-CM

## 2023-09-19 DIAGNOSIS — E782 Mixed hyperlipidemia: Secondary | ICD-10-CM

## 2023-09-19 MED ORDER — REPATHA 140 MG/ML ~~LOC~~ SOSY: 140.0000 mg | PREFILLED_SYRINGE | SUBCUTANEOUS | 2 refills | Status: DC

## 2023-09-19 NOTE — Telephone Encounter (Signed)
 Please see pt response and advise if you would like me to send in Repatha 140mg  every 2 weeks for patient?

## 2023-09-19 NOTE — Telephone Encounter (Signed)
 Prior Authorization form/request asks a question that requires your assistance. Please see the question below and advise accordingly. The PA will not be submitted until the necessary information is received.        Did the patient score a 7 or higher on the Statin-Associated Muscle Symptom Clinical Index (SAMS-CI) and failed statin rechallenge? ACTION REQUIRED: If Yes, attach documentation confirming the SAMS-CI score        (Key: [id number])

## 2023-09-20 ENCOUNTER — Other Ambulatory Visit: Payer: Self-pay

## 2023-09-20 DIAGNOSIS — E782 Mixed hyperlipidemia: Secondary | ICD-10-CM

## 2023-09-20 NOTE — Telephone Encounter (Signed)
 Please see pt msg and advise

## 2023-09-20 NOTE — Telephone Encounter (Signed)
 Please see note for info needed to process PA

## 2023-09-21 NOTE — Progress Notes (Unsigned)
   Alexander Payor, PhD, LAT, ATC acting as a scribe for Clementeen Graham, MD.  Alexander Garza is a 53 y.o. male who presents to [facility] at [facility] today for R shoulder pain x 3 years, worsening over the last 2 wks. Pt locates pain to ***  Radiates: Aggravates: Treatments tried: meloxicam, tramadol,  Pertinent review of systems: ***  Relevant historical information: ***   Exam:  There were no vitals taken for this visit. General: Well Developed, well nourished, and in no acute distress.   MSK: ***    Lab and Radiology Results No results found for this or any previous visit (from the past 72 hours). No results found.     Assessment and Plan: 53 y.o. male with ***   PDMP not reviewed this encounter. No orders of the defined types were placed in this encounter.  No orders of the defined types were placed in this encounter.    Discussed warning signs or symptoms. Please see discharge instructions. Patient expresses understanding.   ***

## 2023-09-22 ENCOUNTER — Ambulatory Visit (INDEPENDENT_AMBULATORY_CARE_PROVIDER_SITE_OTHER)

## 2023-09-22 ENCOUNTER — Encounter: Payer: Self-pay | Admitting: Family Medicine

## 2023-09-22 ENCOUNTER — Other Ambulatory Visit: Payer: Self-pay

## 2023-09-22 ENCOUNTER — Ambulatory Visit: Admitting: Family Medicine

## 2023-09-22 VITALS — BP 102/74 | HR 50 | Ht 71.0 in | Wt 199.0 lb

## 2023-09-22 DIAGNOSIS — M25511 Pain in right shoulder: Secondary | ICD-10-CM | POA: Diagnosis not present

## 2023-09-22 DIAGNOSIS — G8929 Other chronic pain: Secondary | ICD-10-CM | POA: Diagnosis not present

## 2023-09-22 DIAGNOSIS — M7701 Medial epicondylitis, right elbow: Secondary | ICD-10-CM

## 2023-09-22 DIAGNOSIS — M25561 Pain in right knee: Secondary | ICD-10-CM | POA: Diagnosis not present

## 2023-09-22 NOTE — Patient Instructions (Addendum)
 Thank you for coming in today.   You received an injection today. Seek immediate medical attention if the joint becomes red, extremely painful, or is oozing fluid.   Please get an Xray today before you leave   Please use Voltaren gel (Generic Diclofenac Gel) up to 4x daily for pain as needed.  This is available over-the-counter as both the name brand Voltaren gel and the generic diclofenac gel.   Please work on the home exercises the athletic trainer went over with you Shoulder: View at [email or web address] using code [id number] Elbow:View at [email or web address] using code [email or web address]  Rotator cuff impingement  Elbow medial epicondylitis  Check back as needed

## 2023-09-26 ENCOUNTER — Other Ambulatory Visit: Payer: Self-pay | Admitting: Physician Assistant

## 2023-09-26 MED ORDER — EZETIMIBE 10 MG PO TABS: 10.0000 mg | ORAL_TABLET | Freq: Every day | ORAL | 2 refills | Status: DC

## 2023-10-03 NOTE — Progress Notes (Signed)
 Right shoulder x-ray shows a little bit of spur formation that could pinch the rotator cuff.

## 2023-10-13 ENCOUNTER — Other Ambulatory Visit: Payer: Self-pay | Admitting: Physician Assistant

## 2023-10-13 DIAGNOSIS — G8929 Other chronic pain: Secondary | ICD-10-CM

## 2023-10-24 ENCOUNTER — Other Ambulatory Visit: Payer: Self-pay | Admitting: Physician Assistant

## 2023-12-20 ENCOUNTER — Other Ambulatory Visit: Payer: Self-pay

## 2023-12-20 MED ORDER — EZETIMIBE 10 MG PO TABS: 10.0000 mg | ORAL_TABLET | Freq: Every day | ORAL | 2 refills | Status: DC

## 2024-01-03 ENCOUNTER — Telehealth: Payer: Self-pay | Admitting: Physician Assistant

## 2024-01-03 NOTE — Telephone Encounter (Signed)
 Called patient regarding [employer] Re-Enrollment Reminder paperwork, needing to know if he needs help re-enrolling or not. Left voicemail requesting call back.

## 2024-01-04 NOTE — Telephone Encounter (Signed)
 Noted. Will wait for arrival of paperwork.

## 2024-01-04 NOTE — Telephone Encounter (Unsigned)
 Copied from CRM [id number]. Topic: General - Other >> Jan 04, 2024  8:26 AM [place] E wrote: Reason for CRM: Patient's wife, Madelin, was returning a phone call from [place]. Agent relayed message that [place] put in chart and wife stated that she will be in either the end of this week or beginning of next week to drop off that paperwork.

## 2024-01-12 NOTE — Telephone Encounter (Signed)
 Patient dropped off document Patient Assistance Application, to be filled out by provider. Patient requested to send it back via Fax within 5-days. Document is located in providers tray at front office.Please advise

## 2024-01-12 NOTE — Telephone Encounter (Signed)
 Noted

## 2024-01-13 NOTE — Telephone Encounter (Signed)
 Forms for medication assistance completed and in provider basket for signature and to be faxed.

## 2024-01-16 NOTE — Telephone Encounter (Signed)
 Forms faxed and received confirmation of successful fax. Forms placed in drawer for 30 days to ensure successfully scanned into pt chart.

## 2024-02-08 ENCOUNTER — Other Ambulatory Visit: Payer: Self-pay | Admitting: Physician Assistant

## 2024-02-13 ENCOUNTER — Ambulatory Visit
Admission: EM | Admit: 2024-02-13 | Discharge: 2024-02-13 | Disposition: A | Discharge status: Home / Self Care | Attending: Family Medicine | Admitting: Family Medicine

## 2024-02-13 DIAGNOSIS — J3089 Other allergic rhinitis: Secondary | ICD-10-CM | POA: Diagnosis not present

## 2024-02-13 DIAGNOSIS — J0100 Acute maxillary sinusitis, unspecified: Secondary | ICD-10-CM

## 2024-02-13 MED ORDER — AZELASTINE HCL 0.1 % NA SOLN: 1.0000 | Freq: Two times a day (BID) | NASAL | 2 refills | Status: DC

## 2024-02-13 MED ORDER — AMOXICILLIN-POT CLAVULANATE 875-125 MG PO TABS: 1.0000 | ORAL_TABLET | Freq: Two times a day (BID) | ORAL | 0 refills | Status: DC

## 2024-02-13 MED ORDER — PROMETHAZINE-DM 6.25-15 MG/5ML PO SYRP: 5.0000 mL | ORAL_SOLUTION | Freq: Four times a day (QID) | ORAL | 0 refills | Status: DC | PRN

## 2024-02-13 NOTE — Discharge Instructions (Signed)
 In addition to the prescribed medications, you may take DayQuil, NyQuil, Mucinex, use saline sinus rinses several times daily as needed.

## 2024-02-13 NOTE — ED Provider Notes (Signed)
 [facility]    CSN: [id number] Arrival date & time: 02/13/24  9185      History   Chief Complaint Chief Complaint  Patient presents with   Sore Throat   Facial Pain    HPI Alexander Garza is a 53 y.o. male.   Patient presenting today with 2-week history of progressively worsening nasal congestion, facial pain and pressure, sore throat, productive cough, headache.  Denies fever, chest pain, shortness of breath, abdominal pain, vomiting, diarrhea.  So far taking his typical antihistamine regimen and tried 2 days of cough syrup with no relief.  History of sinus infections that have felt similar.    Past Medical History:  Diagnosis Date   Acid reflux disease    Decreased libido 04/23/2019   GERD (gastroesophageal reflux disease) 04/23/2019   HLD (hyperlipidemia) 04/23/2019   Hypercholesteremia    Vitamin D deficiency disease 04/23/2019    Patient Active Problem List   Diagnosis Date Noted   Colon cancer screening 09/15/2023   Family history of colonic polyps 09/15/2023   Aortic atherosclerosis (HCC) 02/26/2022   Nicotine dependence, cigarettes, uncomplicated 02/26/2022   Diverticulosis 02/26/2022   Chronic right shoulder pain 02/26/2022   Arm pain 05/15/2019   Vitamin D deficiency disease 04/23/2019   Mixed hyperlipidemia 04/23/2019   Gastroesophageal reflux disease 04/23/2019   Decreased libido 04/23/2019    Past Surgical History:  Procedure Laterality Date   APPENDECTOMY     fatty tumor removal     chest, arm and leg       Home Medications    Prior to Admission medications   Medication Sig Start Date End Date Taking? Authorizing Provider  amoxicillin -clavulanate (AUGMENTIN ) 875-125 MG tablet Take 1 tablet by mouth every 12 (twelve) hours. 02/13/24  Yes Stuart Vernell Norris, PA-C  azelastine  (ASTELIN ) 0.1 % nasal spray Place 1 spray into both nostrils 2 (two) times daily. Use in each nostril as directed 02/13/24  Yes Stuart Vernell Norris, PA-C   promethazine -dextromethorphan (PROMETHAZINE -DM) 6.25-15 MG/5ML syrup Take 5 mLs by mouth 4 (four) times daily as needed. 02/13/24  Yes Stuart Vernell Norris, PA-C  alfuzosin  (UROXATRAL ) 10 MG 24 hr tablet Take 1 tablet (10 mg total) by mouth 2 (two) times daily. 05/18/23   Matilda Senior, MD  Cholecalciferol (VITAMIN D-3) 125 MCG (5000 UT) TABS Take 2 tablets by mouth daily.    [provider]  cyclobenzaprine  (FLEXERIL ) 10 MG tablet Take 1 tablet (10 mg total) by mouth 3 (three) times daily as needed for muscle spasms. 09/16/22   Allwardt, Alyssa M, PA-C  DEXILANT  60 MG capsule TAKE ONE CAPSULE BY MOUTH DAILY 10/24/23   Allwardt, Alyssa M, PA-C  ezetimibe  (ZETIA ) 10 MG tablet Take 1 tablet (10 mg total) by mouth daily. 12/20/23   Allwardt, Alyssa M, PA-C  fluticasone  (FLONASE ) 50 MCG/ACT nasal spray SPRAY 2 SPRAYS INTO EACH NOSTRIL EVERY DAY 02/08/24   Allwardt, Alyssa M, PA-C  ketoconazole  (NIZORAL ) 2 % cream Apply 1 Application topically daily. 09/16/23   Allwardt, Alyssa M, PA-C  meloxicam  (MOBIC ) 15 MG tablet TAKE 1 TABLET BY MOUTH EVERY DAY WITH FOOD Patient not taking: Reported on 02/13/2024 10/13/23   Allwardt, Mardy HERO, PA-C  Multiple Vitamins-Minerals (MULTI ADULT GUMMIES) CHEW  02/24/22   [provider]  traMADol  (ULTRAM ) 50 MG tablet Take 50 mg by mouth every 6 (six) hours as needed. Patient not taking: Reported on 02/13/2024    [provider]    Family History Family History  Problem  Relation Age of Onset   COPD Mother    Ulcerative colitis Mother    Lung cancer Father     Social History Social History   Tobacco Use   Smoking status: Former    Current packs/day: 0.00    Types: Cigarettes    Quit date: 02/21/2019    Years since quitting: 4.9   Smokeless tobacco: Never  Vaping Use   Vaping status: Never Used  Substance Use Topics   Alcohol use: Not Currently    Alcohol/week: 1.0 standard drink of alcohol    Types: 1 Cans of beer per week   Drug use:  No     Allergies   Zocor [simvastatin]   Review of Systems Review of Systems Per HPI  Physical Exam Triage Vital Signs ED Triage Vitals  Encounter Vitals Group     BP 02/13/24 0827 125/85     Girls Systolic BP Percentile --      Girls Diastolic BP Percentile --      Boys Systolic BP Percentile --      Boys Diastolic BP Percentile --      Pulse Rate 02/13/24 0827 60     Resp 02/13/24 0827 18     Temp 02/13/24 0827 97.9 F (36.6 C)     Temp Source 02/13/24 0827 Oral     SpO2 02/13/24 0827 97 %     Weight --      Height --      Head Circumference --      Peak Flow --      Pain Score 02/13/24 0828 6     Pain Loc --      Pain Education --      Exclude from Growth Chart --    No data found.  Updated Vital Signs BP 125/85 (BP Location: Right Arm)   Pulse 60   Temp 97.9 F (36.6 C) (Oral)   Resp 18   SpO2 97%   Visual Acuity Right Eye Distance:   Left Eye Distance:   Bilateral Distance:    Right Eye Near:   Left Eye Near:    Bilateral Near:     Physical Exam Vitals and nursing note reviewed.  Constitutional:      Appearance: He is well-developed.  HENT:     Head: Atraumatic.     Right Ear: External ear normal.     Left Ear: External ear normal.     Nose: Congestion present.     Mouth/Throat:     Pharynx: Posterior oropharyngeal erythema present. No oropharyngeal exudate.  Eyes:     Conjunctiva/sclera: Conjunctivae normal.     Pupils: Pupils are equal, round, and reactive to light.  Cardiovascular:     Rate and Rhythm: Normal rate and regular rhythm.     Heart sounds: Normal heart sounds.  Pulmonary:     Effort: Pulmonary effort is normal. No respiratory distress.     Breath sounds: No wheezing or rales.  Musculoskeletal:        General: Normal range of motion.     Cervical back: Normal range of motion and neck supple.  Lymphadenopathy:     Cervical: No cervical adenopathy.  Skin:    General: Skin is warm and dry.  Neurological:     Mental  Status: He is alert and oriented to person, place, and time.  Psychiatric:        Behavior: Behavior normal.      UC Treatments / Results  Labs (all  labs ordered are listed, but only abnormal results are displayed) Labs Reviewed - No data to display  EKG   Radiology No results found.  Procedures Procedures (including critical care time)  Medications Ordered in UC Medications - No data to display  Initial Impression / Assessment and Plan / UC Course  I have reviewed the triage vital signs and the nursing notes.  Pertinent labs & imaging results that were available during my care of the patient were reviewed by me and considered in my medical decision making (see chart for details).     Given duration and worsening course, will treat with Augmentin  for sinusitis and Phenergan  DM, Astelin , antihistamine regimen, supportive over-the-counter medications and home care.  Return for worsening symptoms.  Final Clinical Impressions(s) / UC Diagnoses   Final diagnoses:  Acute maxillary sinusitis, recurrence not specified  Seasonal allergic rhinitis due to other allergic trigger     Discharge Instructions      In addition to the prescribed medications, you may take DayQuil, NyQuil, Mucinex, use saline sinus rinses several times daily as needed.    ED Prescriptions     Medication Sig Dispense Auth. Provider   azelastine  (ASTELIN ) 0.1 % nasal spray Place 1 spray into both nostrils 2 (two) times daily. Use in each nostril as directed 30 mL Stuart Vernell Norris, PA-C   promethazine -dextromethorphan (PROMETHAZINE -DM) 6.25-15 MG/5ML syrup Take 5 mLs by mouth 4 (four) times daily as needed. 100 mL Stuart Vernell Norris, PA-C   amoxicillin -clavulanate (AUGMENTIN ) 875-125 MG tablet Take 1 tablet by mouth every 12 (twelve) hours. 14 tablet Stuart Vernell Norris, [place]      PDMP not reviewed this encounter.   Stuart Vernell Norris, [place] 02/13/24 1113

## 2024-02-13 NOTE — ED Triage Notes (Signed)
 Pt being seen in UC for sinus pressure, sore throat, productive cough, and headache x2 weeks. Pt reports sore throat began this week. Pt denies fevers, CP, and SOB. Pt denies taking any otc medication.

## 2024-02-22 ENCOUNTER — Ambulatory Visit
Admission: EM | Admit: 2024-02-22 | Discharge: 2024-02-22 | Disposition: A | Discharge status: Home / Self Care | Attending: Family Medicine | Admitting: Family Medicine

## 2024-02-22 DIAGNOSIS — J0100 Acute maxillary sinusitis, unspecified: Secondary | ICD-10-CM | POA: Diagnosis not present

## 2024-02-22 LAB — POC SOFIA SARS ANTIGEN FIA: SARS Coronavirus 2 Ag: NEGATIVE

## 2024-02-22 MED ORDER — CEFDINIR 300 MG PO CAPS: 300.0000 mg | ORAL_CAPSULE | Freq: Two times a day (BID) | ORAL | 0 refills | Status: DC

## 2024-02-22 MED ORDER — PREDNISONE 50 MG PO TABS
ORAL_TABLET | ORAL | 0 refills | Status: DC
Start: 2024-02-22 — End: 2024-04-04

## 2024-02-22 NOTE — ED Provider Notes (Signed)
 [facility]   [id number] 02/22/24 Arrival Time: 1730  ASSESSMENT & PLAN:  1. Acute non-recurrent maxillary sinusitis    Begin: Meds ordered this encounter  Medications   cefdinir  (OMNICEF ) 300 MG capsule    Sig: Take 1 capsule (300 mg total) by mouth 2 (two) times daily.    Dispense:  20 capsule    Refill:  0   predniSONE  (DELTASONE ) 50 MG tablet    Sig: Take one tablet by mouth for 5 days.    Dispense:  5 tablet    Refill:  0    Discussed typical duration of symptoms. OTC symptom care as needed. Ensure adequate fluid intake and rest.   Follow-up Information     Allwardt, Alyssa M, PA-C.   Specialty: Physician Assistant Why: If worsening or failing to improve as anticipated. Contact information: [address] [place] [address] [phone]                 Reviewed expectations re: course of current medical issues. Questions answered. Outlined signs and symptoms indicating need for more acute intervention. Patient verbalized understanding. After Visit Summary given.   SUBJECTIVE: History from: patient.  Alexander Garza is a 53 y.o. male who presents with complaint of nasal congestion, post-nasal drainage, and sinus pain. Onset gradual, 3 weeks ago. Respiratory symptoms: none. Fever: denied. Overall normal PO intake without n/v.   Social History   Tobacco Use  Smoking Status Former   Current packs/day: 0.00   Types: Cigarettes   Quit date: 02/21/2019   Years since quitting: 5.0  Smokeless Tobacco Never     OBJECTIVE:  Vitals:   02/22/24 1743  BP: 130/86  Pulse: 71  Resp: 20  Temp: 98.4 F (36.9 C)  TempSrc: Oral  SpO2: 94%     General appearance: alert; no distress HEENT: nasal congestion; clear runny nose; throat irritation secondary to post-nasal drainage; bilateral maxillary tenderness to palpation; turbinates boggy Neck: supple without LAD; trachea midline Lungs: unlabored respirations, symmetrical air entry; cough:  absent; no respiratory distress Skin: warm and dry Psychological: alert and cooperative; normal mood and affect  Allergies  Allergen Reactions   Zocor [Simvastatin] Other (See Comments)    Bilateral shoulder myalgias    Past Medical History:  Diagnosis Date   Acid reflux disease    Decreased libido 04/23/2019   GERD (gastroesophageal reflux disease) 04/23/2019   HLD (hyperlipidemia) 04/23/2019   Hypercholesteremia    Vitamin D deficiency disease 04/23/2019   Family History  Problem Relation Age of Onset   COPD Mother    Ulcerative colitis Mother    Lung cancer Father    Social History   Socioeconomic History   Marital status: Married    Spouse name: Not on file   Number of children: Not on file   Years of education: Not on file   Highest education level: Not on file  Occupational History   Not on file  Tobacco Use   Smoking status: Former    Current packs/day: 0.00    Types: Cigarettes    Quit date: 02/21/2019    Years since quitting: 5.0   Smokeless tobacco: Never  Vaping Use   Vaping status: Never Used  Substance and Sexual Activity   Alcohol use: Not Currently    Alcohol/week: 1.0 standard drink of alcohol    Types: 1 Cans of beer per week   Drug use: No   Sexual activity: Yes  Other Topics Concern   Not on file  Social History Narrative   Married for 15 years,second.Lives with wife.Owns business remodelling homes.   Social Drivers of Corporate investment banker Strain: Not on file  Food Insecurity: Not on file  Transportation Needs: Not on file  Physical Activity: Not on file  Stress: Not on file  Social Connections: Not on file  Intimate Partner Violence: Not on file             Rolinda Rogue, MD 02/22/24 1919

## 2024-02-22 NOTE — ED Triage Notes (Signed)
 Pt reports he has sinus pressure/facial pressure, cough, blurry vision, nasal drainage x 3  weeks   Took promethazine  cough syrup and now he cannot smell or taste.

## 2024-03-19 ENCOUNTER — Other Ambulatory Visit: Payer: Self-pay | Admitting: Physician Assistant

## 2024-04-04 ENCOUNTER — Ambulatory Visit: Admitting: Family Medicine

## 2024-04-04 ENCOUNTER — Encounter: Payer: Self-pay | Admitting: Physician Assistant

## 2024-04-04 ENCOUNTER — Encounter: Payer: Self-pay | Admitting: Family Medicine

## 2024-04-04 VITALS — BP 120/80 | HR 70 | Temp 98.8°F | Ht 71.0 in | Wt 194.6 lb

## 2024-04-04 DIAGNOSIS — J309 Allergic rhinitis, unspecified: Secondary | ICD-10-CM

## 2024-04-04 DIAGNOSIS — R432 Parageusia: Secondary | ICD-10-CM | POA: Diagnosis not present

## 2024-04-04 DIAGNOSIS — E559 Vitamin D deficiency, unspecified: Secondary | ICD-10-CM

## 2024-04-04 LAB — B12 AND FOLATE PANEL
Folate: 18.1 ng/mL (ref >5.9)
Vitamin B-12: 418 pg/mL (ref 211–911)

## 2024-04-04 LAB — VITAMIN D 25 HYDROXY (VIT D DEFICIENCY, FRACTURES): VITD: 24.51 ng/mL — ABNORMAL LOW (ref 30.00–100.00)

## 2024-04-04 LAB — TSH: TSH: 0.76 u[IU]/mL (ref 0.35–5.50)

## 2024-04-04 LAB — SEDIMENTATION RATE: Sed Rate: 20 mm/h (ref 0–20)

## 2024-04-04 NOTE — Progress Notes (Signed)
 Subjective  CC:  Chief Complaint  Patient presents with   Sinusitis    Pt was seen in the Urgent care on 9/1 and 9/10 for a sinus infection and was prescribed promethazine  which started to irrigate his mouth with burning, tingling and he lost his taste  buds from it. A COVID test was done which came back negative and he still have not taste besides metal.   Same day acute visit; PCP not available. New pt to me. Chart reviewed.   HPI: Alexander Garza is a 53 y.o. male who presents to the office today to address the problems listed above in the chief complaint. Discussed the use of AI scribe software for clinical note transcription with the patient, who gave verbal consent to proceed.  History of Present Illness Alexander Garza is a 53 year old male who presents with loss of taste and burning mouth sensation.  Dysgeusia and ageusia - Loss of sense of taste began after completing a course of antibiotics prescribed for presumed sinus infection on October 1st, 2025 - I reviewed both urgent care notes.  He was treated with Augmentin  promethazine  DM and Astelin  the first round.  Then treated with Duricef and prednisone  second round.  Fortunately his sinus symptoms have resolved. - Persistent metallic taste in the mouth - Sense of taste has not returned despite improvement in sinus symptoms - Concern that promethazine  may have affected taste buds - Sense of smell may be slightly dulled but still able to smell things  Burning mouth - Burning sensation and numbness of the tongue began after starting initial medication for sinus infection - Burning sensation persists and sometimes worsens spontaneously - Slight improvement in burning after treatment for thrush provided by dentist, but no resolution of taste disturbance - Previously experienced burning sensation with Listerine use, but no longer feels this effect  Oral and dental evaluation - Dental evaluation revealed healthy teeth and gums -  Treated empirically for oral thrush by dentist with slight improvement in burning sensation  Appetite and nutritional impact - Decreased oral intake due to lack of taste - Wife concerned about reduced eating  Allergic rhinitis and environmental exposures - Uses Flonase  and Zyrtec for allergies - Allergy symptoms have worsened in the past few days, possibly due to dusty work environment  Recent infections and testing - Negative COVID-19 test - No prior diagnosis of COVID-19  Self-management and symptom relief - Uses hot tea with honey for symptomatic relief  Vitamin deficiencies - Requests evaluation of vitamin D and B12 levels due to prior trouble with vitamin D  Substance use - No tobacco, alcohol, or drug use   Assessment  1. Dysgeusia   2. Chronic allergic rhinitis   3. Vitamin D deficiency disease      Plan  Assessment and Plan Assessment & Plan Dysgeusia and Burning Mouth  Dysgeusia and burning mouth syndrome with metallic taste and burning sensation in the mouth, onset after medication for presumed sinus infection. No evidence of thrush. Possible causes include medication reaction, viral infection, or nutritional deficiencies, allergies or postinfectious changes. Inflammation of taste buds and recovery time discussed. Consideration of vitamin deficiencies as a contributing factor. 80% of taste is related to smell, potentially affected by allergies. - Order blood tests for vitamin D, B12, folate, and thyroid  levels - Recommend multivitamin, vitamin C, and biotin - Advise maintaining good nutrition despite altered taste - Consider referral to ENT or taste and smell specialist if symptoms persist  Chronic Allergic Rhinitis  Chronic allergic rhinitis with recent exacerbation, symptoms include congestion and exposure to dusty environment. Currently managed with Flonase  and Zyrtec. Discussed role of smell in taste perception and importance of allergy control to improve taste.  Prednisone  not advised due to recent use. - Continue Flonase  and Zyrtec - Consider allergist referral if symptoms are not well controlled      Follow up: prn Orders Placed This Encounter  Procedures   B12 and Folate Panel   TSH   VITAMIN D 25 Hydroxy (Vit-D Deficiency, Fractures)   Sedimentation rate   No orders of the defined types were placed in this encounter.    I reviewed the patients updated PMH, FH, and SocHx.  Patient Active Problem List   Diagnosis Date Noted   Colon cancer screening 09/15/2023   Family history of colonic polyps 09/15/2023   Aortic atherosclerosis 02/26/2022   Nicotine dependence, cigarettes, uncomplicated 02/26/2022   Diverticulosis 02/26/2022   Chronic right shoulder pain 02/26/2022   Arm pain 05/15/2019   Vitamin D deficiency disease 04/23/2019   Mixed hyperlipidemia 04/23/2019   Gastroesophageal reflux disease 04/23/2019   Decreased libido 04/23/2019   Current Meds  Medication Sig   alfuzosin  (UROXATRAL ) 10 MG 24 hr tablet Take 1 tablet (10 mg total) by mouth 2 (two) times daily.   Cholecalciferol (VITAMIN D-3) 125 MCG (5000 UT) TABS Take 2 tablets by mouth daily.   cyclobenzaprine  (FLEXERIL ) 10 MG tablet Take 1 tablet (10 mg total) by mouth 3 (three) times daily as needed for muscle spasms.   DEXILANT  60 MG capsule TAKE ONE CAPSULE BY MOUTH DAILY   ezetimibe  (ZETIA ) 10 MG tablet TAKE 1 TABLET BY MOUTH EVERY DAY   fluticasone  (FLONASE ) 50 MCG/ACT nasal spray SPRAY 2 SPRAYS INTO EACH NOSTRIL EVERY DAY   ketoconazole  (NIZORAL ) 2 % cream Apply 1 Application topically daily.   Multiple Vitamins-Minerals (MULTI ADULT GUMMIES) CHEW    traMADol  (ULTRAM ) 50 MG tablet Take 50 mg by mouth every 6 (six) hours as needed.   [DISCONTINUED] cefdinir  (OMNICEF ) 300 MG capsule Take 1 capsule (300 mg total) by mouth 2 (two) times daily.   [DISCONTINUED] predniSONE  (DELTASONE ) 50 MG tablet Take one tablet by mouth for 5 days.   Allergies: Patient is allergic  to zocor [simvastatin]. Family History: Patient family history includes COPD in his mother; Lung cancer in his father; Ulcerative colitis in his mother. Social History:  Patient  reports that he quit smoking about 5 years ago. His smoking use included cigarettes. He has never used smokeless tobacco. He reports that he does not currently use alcohol after a past usage of about 1.0 standard drink of alcohol per week. He reports that he does not use drugs.  Review of Systems: Constitutional: Negative for fever malaise or anorexia Cardiovascular: negative for chest pain Respiratory: negative for SOB or persistent cough Gastrointestinal: negative for abdominal pain  Objective  Vitals: BP 120/80   Pulse 70   Temp 98.8 F (37.1 C)   Ht 5' 11 (1.803 m)   Wt 194 lb 9.6 oz (88.3 kg)   SpO2 96%   BMI 27.14 kg/m  General: no acute distress , A&Ox3 Psych: tearful at times HEENT: PEERL, conjunctiva normal, neck is supple, nl oral exam. Nl TM  Commons side effects, risks, benefits, and alternatives for medications and treatment plan prescribed today were discussed, and the patient expressed understanding of the given instructions. Patient is instructed to call or message via MyChart if he/she has any questions or concerns regarding  our treatment plan. No barriers to understanding were identified. We discussed Red Flag symptoms and signs in detail. Patient expressed understanding regarding what to do in case of urgent or emergency type symptoms.  Medication list was reconciled, printed and provided to the patient in AVS. Patient instructions and summary information was reviewed with the patient as documented in the AVS. This note was prepared with assistance of Dragon voice recognition software. Occasional wrong-word or sound-a-like substitutions may have occurred due to the inherent limitations of voice recognition software

## 2024-04-04 NOTE — Patient Instructions (Signed)
 Please follow up if symptoms do not improve or as needed.    I would recommend referral to ENT for further evaluation if your taste does not start to improve over time. Sinus infections, allergies and or the promethazine  could be the cause of the taste disturbance.   I will release your lab results to you on your MyChart account with further instructions. You may see the results before I do, but when I review them I will send you a message with my report or have my assistant call you if things need to be discussed. Please reply to my message with any questions. Thank you!

## 2024-04-05 NOTE — Telephone Encounter (Signed)
 Please see pt msg and advise

## 2024-04-07 ENCOUNTER — Ambulatory Visit: Payer: Self-pay | Admitting: Family Medicine

## 2024-04-07 NOTE — Progress Notes (Signed)
 See mychart note Dear Mr. Shankar, Your lab results look good overall. Your vitamin D is a little low and you can take an otc supplement daily to help with that. I have not identified a cause for your taste dysfunction. Sincerely, Dr. Jodie

## 2024-04-14 DIAGNOSIS — R052 Subacute cough: Secondary | ICD-10-CM | POA: Diagnosis not present

## 2024-04-14 DIAGNOSIS — J0191 Acute recurrent sinusitis, unspecified: Secondary | ICD-10-CM | POA: Diagnosis not present

## 2024-04-16 ENCOUNTER — Ambulatory Visit: Admitting: Physician Assistant

## 2024-04-16 ENCOUNTER — Encounter: Payer: Self-pay | Admitting: Physician Assistant

## 2024-04-16 VITALS — BP 116/72 | HR 57 | Temp 98.2°F | Resp 14 | Ht 73.0 in | Wt 195.8 lb

## 2024-04-16 DIAGNOSIS — R432 Parageusia: Secondary | ICD-10-CM | POA: Diagnosis not present

## 2024-04-16 DIAGNOSIS — K146 Glossodynia: Secondary | ICD-10-CM

## 2024-04-16 LAB — COMPREHENSIVE METABOLIC PANEL WITH GFR
ALT: 21 U/L (ref 0–53)
AST: 14 U/L (ref 0–37)
Albumin: 4.4 g/dL (ref 3.5–5.2)
Alkaline Phosphatase: 75 U/L (ref 39–117)
BUN: 17 mg/dL (ref 6–23)
CO2: 26 meq/L (ref 19–32)
Calcium: 9.3 mg/dL (ref 8.4–10.5)
Chloride: 105 meq/L (ref 96–112)
Creatinine, Ser: 0.88 mg/dL (ref 0.40–1.50)
GFR: 98.22 mL/min (ref >60.00)
Glucose, Bld: 94 mg/dL (ref 70–99)
Potassium: 4.4 meq/L (ref 3.5–5.1)
Sodium: 139 meq/L (ref 135–145)
Total Bilirubin: 0.6 mg/dL (ref 0.2–1.2)
Total Protein: 7.1 g/dL (ref 6.0–8.3)

## 2024-04-16 LAB — CBC WITH DIFFERENTIAL/PLATELET
Basophils Absolute: 0 K/uL (ref 0.0–0.1)
Basophils Relative: 0.4 % (ref 0.0–3.0)
Eosinophils Absolute: 0.1 K/uL (ref 0.0–0.7)
Eosinophils Relative: 0.9 % (ref 0.0–5.0)
HCT: 45.8 % (ref 39.0–52.0)
Hemoglobin: 15.7 g/dL (ref 13.0–17.0)
Lymphocytes Relative: 19.8 % (ref 12.0–46.0)
Lymphs Abs: 1.3 K/uL (ref 0.7–4.0)
MCHC: 34.2 g/dL (ref 30.0–36.0)
MCV: 93.6 fl (ref 78.0–100.0)
Monocytes Absolute: 0.5 K/uL (ref 0.1–1.0)
Monocytes Relative: 6.9 % (ref 3.0–12.0)
Neutro Abs: 4.9 K/uL (ref 1.4–7.7)
Neutrophils Relative %: 72 % (ref 43.0–77.0)
Platelets: 174 K/uL (ref 150.0–400.0)
RBC: 4.89 Mil/uL (ref 4.22–5.81)
RDW: 13.6 % (ref 11.5–15.5)
WBC: 6.8 K/uL (ref 4.0–10.5)

## 2024-04-16 LAB — IBC + FERRITIN
Ferritin: 261.6 ng/mL (ref 22.0–322.0)
Iron: 123 ug/dL (ref 42–165)
Saturation Ratios: 43.3 % (ref 20.0–50.0)
TIBC: 284.2 ug/dL (ref 250.0–450.0)
Transferrin: 203 mg/dL — ABNORMAL LOW (ref 212.0–360.0)

## 2024-04-16 MED ORDER — IBUPROFEN 600 MG PO TABS: 600.0000 mg | ORAL_TABLET | Freq: Three times a day (TID) | ORAL | 0 refills | Status: AC | PRN

## 2024-04-16 MED ORDER — MAGIC MOUTHWASH W/LIDOCAINE: 5.0000 mL | Freq: Four times a day (QID) | ORAL | 1 refills | Status: AC | PRN

## 2024-04-16 NOTE — Progress Notes (Signed)
 Patient ID: Alexander Garza, male    DOB: 1971-05-03, 53 y.o.   MRN: [id number]   Assessment & Plan:  Burning mouth syndrome -     CBC with Differential/Platelet -     IBC + Ferritin -     Comprehensive metabolic panel with GFR  Dysgeusia -     CBC with Differential/Platelet -     IBC + Ferritin -     Comprehensive metabolic panel with GFR  Other orders -     Ibuprofen; Take 1 tablet (600 mg total) by mouth every 8 (eight) hours as needed for moderate pain (pain score 4-6). Take with food.  Dispense: 30 tablet; Refill: 0 -     magic mouthwash w/lidocaine ; Take 5 mLs by mouth 4 (four) times daily as needed for up to 10 days for mouth pain.  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 Symptoms include burning sensation in the mouth, loss of taste, and weight loss due to poor oral intake. Differential diagnosis includes allergic reaction, autoimmune condition, or post-viral syndrome. Previous treatments with nystatin mouthwash provided partial relief. Concerns about potential autoimmune etiology and need for further evaluation. Recent use of multiple antibiotics may have contributed to oral thrush risk. ENT referral for further evaluation of sinus issues and potential burning mouth syndrome. - Ordered CBC, glucose, iron levels - Prescribed magic mouthwash with additional ingredients for symptomatic relief. - Advised discontinuation of doxycycline due to excessive antibiotic use. - Referred to ENT for further evaluation of sinus issues and potential burning mouth syndrome. - Prescribed prescription-strength ibuprofen for pain management. - Advised follow-up with ENT if no contact by Thursday or Friday.      Return if symptoms worsen or fail to improve.    Subjective:    Chief Complaint  Patient presents  with   Sinus Problem    Says he has been suffering for about a week now. Expressed that the most pain in the burning in the mouth. Also complains of post nasal drip.    Sinus Problem   Discussed the use of AI scribe software for clinical note transcription with the patient, who gave verbal consent to proceed.  History of Present Illness Alexander Garza is a 53 year old male who presents with a five-week history of burning mouth and loss of taste. Here with wife.   He has experienced a burning sensation in his mouth for five weeks, which began after taking a cough medicine prescribed at an urgent care visit on September 1st. The sensation is described as burning and tingling, initially mistaken for a numbing effect similar to chloraseptic. Despite completing the medication, the burning has not resolved. He also reports a complete loss of taste, with everything tasting metallic and unpleasant, leading to weight loss due to decreased food intake.  He has been evaluated by a dentist who confirmed healthy teeth and gums and prescribed treatment for oral thrush, which provided minimal relief. He has tried using biotin toothpaste and mouthwash and has avoided spicy foods, but these measures have not significantly improved his symptoms.  His medical history includes low vitamin D levels and borderline low vitamin B12 levels. He has previously received B12 injections due to low energy levels. He is concerned about his red blood cell count, which has been problematic in the past, leading  to a referral to an oncologist. He also has a history of acid reflux and is a former smoker.  He has been tested for COVID-19, which was negative, and has been on multiple antibiotics over the past five weeks, including Augmentin , Omnicef , and most recently doxycycline, which he started after experiencing severe congestion and difficulty breathing. Doxycycline has helped clear his congestion.  He is concerned about the  possibility of an autoimmune disorder contributing to his symptoms.  He is able to smell but not taste, and he has been experiencing significant congestion and sinus issues.     Past Medical History:  Diagnosis Date   Acid reflux disease    Decreased libido 04/23/2019   GERD (gastroesophageal reflux disease) 04/23/2019   HLD (hyperlipidemia) 04/23/2019   Hypercholesteremia    Vitamin D deficiency disease 04/23/2019    Past Surgical History:  Procedure Laterality Date   APPENDECTOMY     fatty tumor removal     chest, arm and leg    Family History  Problem Relation Age of Onset   COPD Mother    Ulcerative colitis Mother    Lung cancer Father     Social History   Tobacco Use   Smoking status: Former    Current packs/day: 0.00    Types: Cigarettes    Quit date: 02/21/2019    Years since quitting: 5.1   Smokeless tobacco: Never  Vaping Use   Vaping status: Never Used  Substance Use Topics   Alcohol use: Not Currently    Alcohol/week: 1.0 standard drink of alcohol    Types: 1 Cans of beer per week   Drug use: No     Allergies  Allergen Reactions   Zocor [Simvastatin] Other (See Comments)    Bilateral shoulder myalgias    Review of Systems NEGATIVE UNLESS OTHERWISE INDICATED IN HPI      Objective:     BP 116/72   Pulse (!) 57   Temp 98.2 F (36.8 C) (Temporal)   Resp 14   Ht 6' 1 (1.854 m)   Wt 195 lb 12.8 oz (88.8 kg)   SpO2 98%   BMI 25.83 kg/m   Wt Readings from Last 3 Encounters:  04/16/24 195 lb 12.8 oz (88.8 kg)  04/04/24 194 lb 9.6 oz (88.3 kg)  09/22/23 199 lb (90.3 kg)    BP Readings from Last 3 Encounters:  04/16/24 116/72  04/04/24 120/80  02/22/24 130/86     Physical Exam Vitals and nursing note reviewed.  Constitutional:      General: He is not in acute distress.    Appearance: Normal appearance. He is not ill-appearing.  HENT:     Head: Normocephalic.     Right Ear: Tympanic membrane, ear canal and external ear normal.      Left Ear: Tympanic membrane, ear canal and external ear normal.     Nose: No congestion.     Mouth/Throat:     Mouth: Mucous membranes are moist.     Pharynx: No oropharyngeal exudate or posterior oropharyngeal erythema.  Eyes:     Extraocular Movements: Extraocular movements intact.     Conjunctiva/sclera: Conjunctivae normal.     Pupils: Pupils are equal, round, and reactive to light.  Cardiovascular:     Rate and Rhythm: Normal rate and regular rhythm.     Pulses: Normal pulses.     Heart sounds: Normal heart sounds. No murmur heard. Pulmonary:     Effort: Pulmonary effort is normal. No respiratory  distress.     Breath sounds: Normal breath sounds. No wheezing.  Musculoskeletal:     Cervical back: Normal range of motion.  Skin:    General: Skin is warm.  Neurological:     Mental Status: He is alert and oriented to person, place, and time.  Psychiatric:        Mood and Affect: Mood normal.        Behavior: Behavior normal.          Time Spent: 30 minutes of total time was spent on the date of the encounter performing the following actions: chart review prior to seeing the patient, obtaining history, performing a medically necessary exam, counseling on the treatment plan, placing orders, and documenting in our EHR.     Erykah Lippert M Milayah Krell, PA-C

## 2024-04-17 ENCOUNTER — Encounter: Payer: Self-pay | Admitting: Physician Assistant

## 2024-04-17 ENCOUNTER — Ambulatory Visit: Payer: Self-pay | Admitting: Physician Assistant

## 2024-04-17 ENCOUNTER — Other Ambulatory Visit: Payer: Self-pay

## 2024-04-17 DIAGNOSIS — E559 Vitamin D deficiency, unspecified: Secondary | ICD-10-CM

## 2024-04-17 DIAGNOSIS — Z8709 Personal history of other diseases of the respiratory system: Secondary | ICD-10-CM | POA: Diagnosis not present

## 2024-04-17 DIAGNOSIS — J342 Deviated nasal septum: Secondary | ICD-10-CM | POA: Diagnosis not present

## 2024-04-17 DIAGNOSIS — K219 Gastro-esophageal reflux disease without esophagitis: Secondary | ICD-10-CM | POA: Diagnosis not present

## 2024-04-17 DIAGNOSIS — R09A2 Foreign body sensation, throat: Secondary | ICD-10-CM | POA: Diagnosis not present

## 2024-04-17 DIAGNOSIS — K146 Glossodynia: Secondary | ICD-10-CM | POA: Diagnosis not present

## 2024-04-17 MED ORDER — VITAMIN D (ERGOCALCIFEROL) 1.25 MG (50000 UNIT) PO CAPS: 50000.0000 [IU] | ORAL_CAPSULE | ORAL | 0 refills | Status: AC

## 2024-04-23 ENCOUNTER — Other Ambulatory Visit: Payer: Self-pay

## 2024-04-23 DIAGNOSIS — E611 Iron deficiency: Secondary | ICD-10-CM

## 2024-04-23 NOTE — Telephone Encounter (Signed)
 Ok to place lab orders to also recheck iron

## 2024-04-30 NOTE — Progress Notes (Unsigned)
 Impression/Assessment:  1.  Low testosterone , history of.  He is off repletion still with normal testosterone  levels  2.  Screening for prostate cancer, normal PSA-stable at 0.4  3.  BPH with symptoms-on alfuzosin .  Getting a bit worse. Plan:    History of Present Illness: 53 yo male presents for f/u of BPH w/ LUTS, as well as h/o low testosterone .  He is dependent on alfuzosin  to help him void better.    He does have low T and is on repletion w/ 100 mg IM weekly. He did take a break from injections in the past d/t erythrocytosis.  Recent labs--  Hgb/Hct--16/48.4 Total T--347 (drawn on Thursday) PSA--0.4  Since his last visit, he has stopped his testosterone  administration. IPSS 16/4.  He is on alfuzosin .  He ask whether it would be okay to double his dose.    Past Medical History:  Diagnosis Date   Acid reflux disease    Decreased libido 04/23/2019   GERD (gastroesophageal reflux disease) 04/23/2019   HLD (hyperlipidemia) 04/23/2019   Hypercholesteremia    Vitamin D deficiency disease 04/23/2019    Past Surgical History:  Procedure Laterality Date   APPENDECTOMY     fatty tumor removal     chest, arm and leg    Home Medications:  Allergies as of 05/01/2024       Reactions   Zocor [simvastatin] Other (See Comments)   Bilateral shoulder myalgias        Medication List        Accurate as of April 30, 2024 12:00 PM. If you have any questions, ask your nurse or doctor.          alfuzosin  10 MG 24 hr tablet Commonly known as: UROXATRAL  Take 1 tablet (10 mg total) by mouth 2 (two) times daily.   Dexilant  60 MG capsule Generic drug: dexlansoprazole  TAKE ONE CAPSULE BY MOUTH DAILY   ezetimibe  10 MG tablet Commonly known as: ZETIA  TAKE 1 TABLET BY MOUTH EVERY DAY   fluticasone  50 MCG/ACT nasal spray Commonly known as: FLONASE  SPRAY 2 SPRAYS INTO EACH NOSTRIL EVERY DAY   ibuprofen 600 MG tablet Commonly known as: ADVIL Take 1 tablet (600 mg  total) by mouth every 8 (eight) hours as needed for moderate pain (pain score 4-6). Take with food.   ketoconazole  2 % cream Commonly known as: NIZORAL  Apply 1 Application topically daily.   Multi Adult Gummies Chew   Vitamin D (Ergocalciferol) 1.25 MG (50000 UNIT) Caps capsule Commonly known as: DRISDOL Take 1 capsule (50,000 Units total) by mouth every 7 (seven) days.        Allergies:  Allergies  Allergen Reactions   Zocor [Simvastatin] Other (See Comments)    Bilateral shoulder myalgias    Family History  Problem Relation Age of Onset   COPD Mother    Ulcerative colitis Mother    Lung cancer Father     Social History:  reports that he quit smoking about 5 years ago. His smoking use included cigarettes. He has never used smokeless tobacco. He reports that he does not currently use alcohol after a past usage of about 1.0 standard drink of alcohol per week. He reports that he does not use drugs.  ROS: A complete review of systems was performed.  All systems are negative except for pertinent findings as noted.  Physical Exam:  Vital signs in last 24 hours: There were no vitals taken for this visit. Constitutional:  Alert and oriented, No acute  distress Cardiovascular: Regular rate  Respiratory: Normal respiratory effort Neurologic: Grossly intact, no focal deficits Psychiatric: Normal mood and affect  I have reviewed prior pt notes  I have reviewed urinalysis, PSA, free and total testosterone  results results as well as hemoglobin/hematocrit  I have independently reviewed prior imaging--CT A/P  IPSS form reviewed.

## 2024-05-01 ENCOUNTER — Ambulatory Visit: Payer: 59 | Admitting: Urology

## 2024-05-01 ENCOUNTER — Encounter: Payer: Self-pay | Admitting: Urology

## 2024-05-01 VITALS — BP 124/78 | HR 62

## 2024-05-01 DIAGNOSIS — N401 Enlarged prostate with lower urinary tract symptoms: Secondary | ICD-10-CM | POA: Diagnosis not present

## 2024-05-01 DIAGNOSIS — R350 Frequency of micturition: Secondary | ICD-10-CM

## 2024-05-01 DIAGNOSIS — Z125 Encounter for screening for malignant neoplasm of prostate: Secondary | ICD-10-CM | POA: Diagnosis not present

## 2024-05-01 DIAGNOSIS — Z87438 Personal history of other diseases of male genital organs: Secondary | ICD-10-CM | POA: Diagnosis not present

## 2024-05-01 DIAGNOSIS — N529 Male erectile dysfunction, unspecified: Secondary | ICD-10-CM | POA: Diagnosis not present

## 2024-05-01 DIAGNOSIS — N5201 Erectile dysfunction due to arterial insufficiency: Secondary | ICD-10-CM

## 2024-05-01 DIAGNOSIS — R7989 Other specified abnormal findings of blood chemistry: Secondary | ICD-10-CM

## 2024-05-01 LAB — URINALYSIS, ROUTINE W REFLEX MICROSCOPIC
Bilirubin, UA: NEGATIVE
Glucose, UA: NEGATIVE
Ketones, UA: NEGATIVE
Leukocytes,UA: NEGATIVE
Nitrite, UA: NEGATIVE
Protein,UA: NEGATIVE
RBC, UA: NEGATIVE
Specific Gravity, UA: 1.015 (ref 1.005–1.030)
Urobilinogen, Ur: 0.2 mg/dL (ref 0.2–1.0)
pH, UA: 6 (ref 5.0–7.5)

## 2024-05-01 MED ORDER — SILDENAFIL CITRATE 100 MG PO TABS: ORAL_TABLET | ORAL | 99 refills | Status: DC

## 2024-05-02 ENCOUNTER — Ambulatory Visit: Payer: Self-pay | Admitting: Urology

## 2024-05-02 ENCOUNTER — Other Ambulatory Visit: Payer: Self-pay

## 2024-05-02 DIAGNOSIS — R350 Frequency of micturition: Secondary | ICD-10-CM

## 2024-05-02 LAB — PSA: Prostate Specific Ag, Serum: 0.3 ng/mL (ref 0.0–4.0)

## 2024-05-02 MED ORDER — ALFUZOSIN HCL ER 10 MG PO TB24: 10.0000 mg | ORAL_TABLET | Freq: Two times a day (BID) | ORAL | 3 refills | Status: AC

## 2024-05-15 DIAGNOSIS — K5730 Diverticulosis of large intestine without perforation or abscess without bleeding: Secondary | ICD-10-CM | POA: Diagnosis not present

## 2024-05-15 DIAGNOSIS — R1310 Dysphagia, unspecified: Secondary | ICD-10-CM | POA: Diagnosis not present

## 2024-05-15 DIAGNOSIS — K219 Gastro-esophageal reflux disease without esophagitis: Secondary | ICD-10-CM | POA: Diagnosis not present

## 2024-05-15 DIAGNOSIS — R634 Abnormal weight loss: Secondary | ICD-10-CM | POA: Diagnosis not present

## 2024-05-16 DIAGNOSIS — K219 Gastro-esophageal reflux disease without esophagitis: Secondary | ICD-10-CM | POA: Diagnosis not present

## 2024-05-16 DIAGNOSIS — R634 Abnormal weight loss: Secondary | ICD-10-CM | POA: Diagnosis not present

## 2024-05-16 DIAGNOSIS — R1310 Dysphagia, unspecified: Secondary | ICD-10-CM | POA: Diagnosis not present

## 2024-05-16 DIAGNOSIS — K2951 Unspecified chronic gastritis with bleeding: Secondary | ICD-10-CM | POA: Diagnosis not present

## 2024-05-16 DIAGNOSIS — K317 Polyp of stomach and duodenum: Secondary | ICD-10-CM | POA: Diagnosis not present

## 2024-05-18 ENCOUNTER — Other Ambulatory Visit: Payer: Self-pay

## 2024-05-21 ENCOUNTER — Encounter: Payer: Self-pay | Admitting: Physician Assistant

## 2024-05-22 ENCOUNTER — Other Ambulatory Visit: Payer: Self-pay | Admitting: Family

## 2024-05-22 MED ORDER — LIDOCAINE VISCOUS HCL 2 % MT SOLN: 5.0000 mL | Freq: Three times a day (TID) | OROMUCOSAL | 1 refills | Status: DC | PRN

## 2024-05-22 NOTE — Telephone Encounter (Signed)
 Please see pt msg and advise on refill

## 2024-05-23 ENCOUNTER — Other Ambulatory Visit: Payer: Self-pay

## 2024-05-23 MED ORDER — LIDOCAINE VISCOUS HCL 2 % MT SOLN: 5.0000 mL | Freq: Three times a day (TID) | OROMUCOSAL | 1 refills | Status: AC | PRN

## 2024-05-29 ENCOUNTER — Telehealth: Payer: Self-pay

## 2024-05-29 NOTE — Telephone Encounter (Signed)
Patient has appointment on 06/15/16. 

## 2024-05-29 NOTE — Telephone Encounter (Signed)
 Appointment with Job on 12/18

## 2024-05-31 ENCOUNTER — Ambulatory Visit: Admitting: Physician Assistant

## 2024-05-31 DIAGNOSIS — R432 Parageusia: Secondary | ICD-10-CM | POA: Diagnosis not present

## 2024-05-31 DIAGNOSIS — K219 Gastro-esophageal reflux disease without esophagitis: Secondary | ICD-10-CM | POA: Diagnosis not present

## 2024-05-31 DIAGNOSIS — K5730 Diverticulosis of large intestine without perforation or abscess without bleeding: Secondary | ICD-10-CM | POA: Diagnosis not present

## 2024-06-11 ENCOUNTER — Other Ambulatory Visit: Payer: Self-pay

## 2024-06-11 MED ORDER — EZETIMIBE 10 MG PO TABS: 10.0000 mg | ORAL_TABLET | Freq: Every day | ORAL | 2 refills | Status: AC

## 2024-09-20 ENCOUNTER — Encounter: Admitting: Physician Assistant

## 2025-06-03 ENCOUNTER — Ambulatory Visit: Admitting: Urology
